# Patient Record
Sex: Male | Born: 1985 | Race: Black or African American | Hispanic: No | Marital: Single | Smoking: Current every day smoker
Health system: Southern US, Community
[De-identification: ages and names within clinical notes are randomized; demographics above are authoritative.]

## PROBLEM LIST (undated history)

## (undated) DIAGNOSIS — Z21 Asymptomatic human immunodeficiency virus [HIV] infection status: Secondary | ICD-10-CM

## (undated) DIAGNOSIS — Z9109 Other allergy status, other than to drugs and biological substances: Secondary | ICD-10-CM

## (undated) DIAGNOSIS — I10 Essential (primary) hypertension: Secondary | ICD-10-CM

## (undated) DIAGNOSIS — B20 Human immunodeficiency virus [HIV] disease: Secondary | ICD-10-CM

## (undated) HISTORY — PX: HERNIA REPAIR: SHX51

---
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
## 2006-03-17 ENCOUNTER — Emergency Department (HOSPITAL_COMMUNITY): Admission: EM | Admit: 2006-03-17 | Discharge: 2006-03-17 | Payer: Self-pay | Admitting: Family Medicine

## 2006-05-19 ENCOUNTER — Emergency Department (HOSPITAL_COMMUNITY): Admission: EM | Admit: 2006-05-19 | Discharge: 2006-05-19 | Payer: Self-pay | Admitting: Emergency Medicine

## 2007-01-28 ENCOUNTER — Emergency Department (HOSPITAL_COMMUNITY): Admission: EM | Admit: 2007-01-28 | Discharge: 2007-01-28 | Payer: Self-pay | Admitting: Emergency Medicine

## 2007-08-27 ENCOUNTER — Emergency Department (HOSPITAL_COMMUNITY): Admission: EM | Admit: 2007-08-27 | Discharge: 2007-08-27 | Payer: Self-pay | Admitting: Emergency Medicine

## 2007-08-29 ENCOUNTER — Emergency Department (HOSPITAL_COMMUNITY): Admission: EM | Admit: 2007-08-29 | Discharge: 2007-08-29 | Payer: Self-pay | Admitting: Emergency Medicine

## 2007-12-22 ENCOUNTER — Emergency Department (HOSPITAL_COMMUNITY): Admission: EM | Admit: 2007-12-22 | Discharge: 2007-12-22 | Payer: Self-pay | Admitting: Emergency Medicine

## 2008-01-26 ENCOUNTER — Emergency Department (HOSPITAL_COMMUNITY): Admission: EM | Admit: 2008-01-26 | Discharge: 2008-01-26 | Payer: Self-pay | Admitting: Emergency Medicine

## 2008-07-23 ENCOUNTER — Emergency Department (HOSPITAL_COMMUNITY): Admission: EM | Admit: 2008-07-23 | Discharge: 2008-07-23 | Payer: Self-pay | Admitting: Family Medicine

## 2008-10-29 ENCOUNTER — Emergency Department (HOSPITAL_COMMUNITY): Admission: EM | Admit: 2008-10-29 | Discharge: 2008-10-29 | Payer: Self-pay | Admitting: Emergency Medicine

## 2008-11-15 ENCOUNTER — Emergency Department (HOSPITAL_COMMUNITY): Admission: EM | Admit: 2008-11-15 | Discharge: 2008-11-15 | Payer: Self-pay | Admitting: Emergency Medicine

## 2009-05-25 ENCOUNTER — Emergency Department (HOSPITAL_COMMUNITY): Admission: EM | Admit: 2009-05-25 | Discharge: 2009-05-25 | Payer: Self-pay | Admitting: Emergency Medicine

## 2009-06-08 ENCOUNTER — Emergency Department (HOSPITAL_COMMUNITY): Admission: EM | Admit: 2009-06-08 | Discharge: 2009-06-08 | Payer: Self-pay | Admitting: Emergency Medicine

## 2009-06-11 ENCOUNTER — Emergency Department (HOSPITAL_COMMUNITY): Admission: EM | Admit: 2009-06-11 | Discharge: 2009-06-11 | Payer: Self-pay | Admitting: Emergency Medicine

## 2010-03-17 ENCOUNTER — Emergency Department (HOSPITAL_COMMUNITY)
Admission: EM | Admit: 2010-03-17 | Discharge: 2010-03-17 | Disposition: A | Discharge status: Home / Self Care | Payer: Self-pay | Attending: Emergency Medicine | Admitting: Emergency Medicine

## 2010-03-17 DIAGNOSIS — R369 Urethral discharge, unspecified: Secondary | ICD-10-CM | POA: Insufficient documentation

## 2010-03-17 DIAGNOSIS — Z202 Contact with and (suspected) exposure to infections with a predominantly sexual mode of transmission: Secondary | ICD-10-CM | POA: Insufficient documentation

## 2010-03-17 DIAGNOSIS — R300 Dysuria: Secondary | ICD-10-CM | POA: Insufficient documentation

## 2010-03-19 LAB — GC/CHLAMYDIA PROBE AMP, GENITAL: GC Probe Amp, Genital: NEGATIVE

## 2010-05-09 LAB — GC/CHLAMYDIA PROBE AMP, GENITAL: GC Probe Amp, Genital: NEGATIVE

## 2010-09-15 ENCOUNTER — Emergency Department (HOSPITAL_COMMUNITY): Payer: Self-pay

## 2010-09-15 ENCOUNTER — Emergency Department (HOSPITAL_COMMUNITY)
Admission: EM | Admit: 2010-09-15 | Discharge: 2010-09-15 | Disposition: A | Discharge status: Home / Self Care | Payer: No Typology Code available for payment source | Attending: Emergency Medicine | Admitting: Emergency Medicine

## 2010-09-15 DIAGNOSIS — M549 Dorsalgia, unspecified: Secondary | ICD-10-CM | POA: Insufficient documentation

## 2010-09-15 DIAGNOSIS — M25519 Pain in unspecified shoulder: Secondary | ICD-10-CM | POA: Insufficient documentation

## 2010-09-15 DIAGNOSIS — IMO0002 Reserved for concepts with insufficient information to code with codable children: Secondary | ICD-10-CM | POA: Insufficient documentation

## 2010-09-15 DIAGNOSIS — G8929 Other chronic pain: Secondary | ICD-10-CM | POA: Insufficient documentation

## 2010-10-16 ENCOUNTER — Inpatient Hospital Stay (INDEPENDENT_AMBULATORY_CARE_PROVIDER_SITE_OTHER)
Admission: RE | Admit: 2010-10-16 | Discharge: 2010-10-16 | Disposition: A | Discharge status: Home / Self Care | Payer: No Typology Code available for payment source | Source: Ambulatory Visit | Attending: Family Medicine | Admitting: Family Medicine

## 2010-10-16 DIAGNOSIS — J309 Allergic rhinitis, unspecified: Secondary | ICD-10-CM

## 2010-10-16 DIAGNOSIS — J45909 Unspecified asthma, uncomplicated: Secondary | ICD-10-CM

## 2010-11-05 LAB — GC/CHLAMYDIA PROBE AMP, GENITAL: Chlamydia, DNA Probe: NEGATIVE

## 2010-11-08 LAB — RPR: RPR Ser Ql: NONREACTIVE

## 2011-03-30 ENCOUNTER — Encounter (HOSPITAL_COMMUNITY): Payer: Self-pay | Admitting: *Deleted

## 2011-03-30 ENCOUNTER — Emergency Department (HOSPITAL_COMMUNITY)
Admission: EM | Admit: 2011-03-30 | Discharge: 2011-03-30 | Disposition: A | Discharge status: Home / Self Care | Payer: Self-pay | Attending: Emergency Medicine | Admitting: Emergency Medicine

## 2011-03-30 DIAGNOSIS — R112 Nausea with vomiting, unspecified: Secondary | ICD-10-CM | POA: Insufficient documentation

## 2011-03-30 DIAGNOSIS — R51 Headache: Secondary | ICD-10-CM | POA: Insufficient documentation

## 2011-03-30 DIAGNOSIS — H53149 Visual discomfort, unspecified: Secondary | ICD-10-CM | POA: Insufficient documentation

## 2011-03-30 MED ORDER — METOCLOPRAMIDE HCL 5 MG/ML IJ SOLN
10.0000 mg | Freq: Once | INTRAMUSCULAR | Status: AC
  Administered 2011-03-30: 10 mg via INTRAMUSCULAR
  Filled 2011-03-30: qty 2

## 2011-03-30 MED ORDER — NAPROXEN 500 MG PO TABS: 500.0000 mg | ORAL_TABLET | Freq: Two times a day (BID) | ORAL | Status: DC

## 2011-03-30 MED ORDER — OXYCODONE-ACETAMINOPHEN 5-325 MG PO TABS
2.0000 | ORAL_TABLET | Freq: Once | ORAL | Status: AC
  Administered 2011-03-30: 2 via ORAL
  Filled 2011-03-30: qty 2

## 2011-03-30 NOTE — ED Provider Notes (Signed)
History     CSN: [id number]  Arrival date & time 03/30/11  2001   First MD Initiated Contact with Patient 03/30/11 2153      Chief Complaint  Patient presents with  . Migraine    (Consider location/radiation/quality/duration/timing/severity/associated sxs/prior treatment) Patient is a 26 y.o. male presenting with migraine. The history is provided by the patient.  Migraine   patient reports 4 days of right sided headache with nausea and one episode of vomiting.  He's had no nausea or vomiting today.  He has a prior history of headaches in high school.  There is no family history of migraine headaches.  He has photophobia and phonophobia.  He denies arm or leg weakness.  He denies chest pain shortness of breath abdominal pain.  He denies fevers and chills.  He denies neck pain.  Nothing improves his symptoms.  His pain is moderate at this time  Past Medical History  Diagnosis Date  . Migraine     No past surgical history on file.  No family history on file.  History  Substance Use Topics  . Smoking status: Not on file  . Smokeless tobacco: Not on file  . Alcohol Use:       Review of Systems  All other systems reviewed and are negative.    Allergies  Review of patient's allergies indicates no known allergies.  Home Medications   Current Outpatient Rx  Name Route Sig Dispense Refill  . NAPROXEN 500 MG PO TABS Oral Take 1 tablet (500 mg total) by mouth 2 (two) times daily. 10 tablet 0    BP 129/71  Pulse 95  Temp(Src) 99.1 F (37.3 C) (Oral)  Resp 18  SpO2 100%  Physical Exam  Nursing note and vitals reviewed. Constitutional: He is oriented to person, place, and time. He appears well-developed and well-nourished.  HENT:  Head: Normocephalic and atraumatic.  Eyes: Pupils are equal, round, and reactive to light.  Cardiovascular: Regular rhythm.   Pulmonary/Chest: Effort normal.  Abdominal: Soft.  Neurological: He is alert and oriented to person, place,  and time.       5/5 strength in major muscle groups of  bilateral upper and lower extremities. Speech normal. No facial asymetry.     ED Course  Procedures (including critical care time)  Labs Reviewed - No data to display No results found.   1. Headache       MDM  Typical migraine headache for the pt. Non focal neuro exam. No recent head trauma. No fever. Doubt meningitis. Doubt intracranial bleed. Doubt normal pressure hydrocephalus. No indication for imaging. Will treat with migraine cocktail and reevaluate  11:15 PM  the patient feels much better at this time will DC home.  His neurologic exam is normal        Lyanne Co, MD 03/30/11 2315

## 2011-03-30 NOTE — ED Notes (Signed)
Pt in c/o headache with n/v since Tuesday, history of same

## 2011-03-30 NOTE — Discharge Instructions (Signed)

## 2011-05-19 ENCOUNTER — Encounter (HOSPITAL_COMMUNITY): Payer: Self-pay | Admitting: *Deleted

## 2011-05-19 ENCOUNTER — Emergency Department (HOSPITAL_COMMUNITY)
Admission: EM | Admit: 2011-05-19 | Discharge: 2011-05-19 | Disposition: A | Discharge status: Home / Self Care | Payer: Self-pay | Attending: Emergency Medicine | Admitting: Emergency Medicine

## 2011-05-19 DIAGNOSIS — H579 Unspecified disorder of eye and adnexa: Secondary | ICD-10-CM | POA: Insufficient documentation

## 2011-05-19 DIAGNOSIS — J309 Allergic rhinitis, unspecified: Secondary | ICD-10-CM | POA: Insufficient documentation

## 2011-05-19 DIAGNOSIS — R1110 Vomiting, unspecified: Secondary | ICD-10-CM | POA: Insufficient documentation

## 2011-05-19 DIAGNOSIS — H5789 Other specified disorders of eye and adnexa: Secondary | ICD-10-CM | POA: Insufficient documentation

## 2011-05-19 DIAGNOSIS — R6889 Other general symptoms and signs: Secondary | ICD-10-CM | POA: Insufficient documentation

## 2011-05-19 DIAGNOSIS — J3489 Other specified disorders of nose and nasal sinuses: Secondary | ICD-10-CM | POA: Insufficient documentation

## 2011-05-19 DIAGNOSIS — J302 Other seasonal allergic rhinitis: Secondary | ICD-10-CM

## 2011-05-19 HISTORY — DX: Other allergy status, other than to drugs and biological substances: Z91.09

## 2011-05-19 MED ORDER — DIPHENHYDRAMINE HCL 25 MG PO CAPS
25.0000 mg | ORAL_CAPSULE | Freq: Once | ORAL | Status: AC
  Administered 2011-05-19: 25 mg via ORAL
  Filled 2011-05-19: qty 1

## 2011-05-19 NOTE — ED Notes (Signed)
Pt reports Friday he started to have sneezing "real bad", then vomited x 1 Saturday.  Denies any nausea at this time. Pt also reports abd pain today, woke up with "crusty eyes." Denies taking anything for allergies.

## 2011-05-19 NOTE — Discharge Instructions (Signed)
Take over the counter Zyrtec, Claritin, Allegra, or Benadryl for your symptoms.    Allergies, Generic Allergies may happen from anything your body is sensitive to. This may be food, medicines, pollens, chemicals, and nearly anything around you in everyday life that produces allergens. An allergen is anything that causes an allergy producing substance. Heredity is often a factor in causing these problems. This means you may have some of the same allergies as your parents. Food allergies happen in all age groups. Food allergies are some of the most severe and life threatening. Some common food allergies are cow's milk, seafood, eggs, nuts, wheat, and soybeans. SYMPTOMS   Swelling around the mouth.   An itchy red rash or hives.   Vomiting or diarrhea.   Difficulty breathing.  SEVERE ALLERGIC REACTIONS ARE LIFE-THREATENING. This reaction is called anaphylaxis. It can cause the mouth and throat to swell and cause difficulty with breathing and swallowing. In severe reactions only a trace amount of food (for example, peanut oil in a salad) may cause death within seconds. Seasonal allergies occur in all age groups. These are seasonal because they usually occur during the same season every year. They may be a reaction to molds, grass pollens, or tree pollens. Other causes of problems are house dust mite allergens, pet dander, and mold spores. The symptoms often consist of nasal congestion, a runny itchy nose associated with sneezing, and tearing itchy eyes. There is often an associated itching of the mouth and ears. The problems happen when you come in contact with pollens and other allergens. Allergens are the particles in the air that the body reacts to with an allergic reaction. This causes you to release allergic antibodies. Through a chain of events, these eventually cause you to release histamine into the blood stream. Although it is meant to be protective to the body, it is this release that causes  your discomfort. This is why you were given anti-histamines to feel better. If you are unable to pinpoint the offending allergen, it may be determined by skin or blood testing. Allergies cannot be cured but can be controlled with medicine. Hay fever is a collection of all or some of the seasonal allergy problems. It may often be treated with simple over-the-counter medicine such as diphenhydramine. Take medicine as directed. Do not drink alcohol or drive while taking this medicine. Check with your caregiver or package insert for child dosages. If these medicines are not effective, there are many new medicines your caregiver can prescribe. Stronger medicine such as nasal spray, eye drops, and corticosteroids may be used if the first things you try do not work well. Other treatments such as immunotherapy or desensitizing injections can be used if all else fails. Follow up with your caregiver if problems continue. These seasonal allergies are usually not life threatening. They are generally more of a nuisance that can often be handled using medicine. HOME CARE INSTRUCTIONS   If unsure what causes a reaction, keep a diary of foods eaten and symptoms that follow. Avoid foods that cause reactions.   If hives or rash are present:   Take medicine as directed.   You may use an over-the-counter antihistamine (diphenhydramine) for hives and itching as needed.   Apply cold compresses (cloths) to the skin or take baths in cool water. Avoid hot baths or showers. Heat will make a rash and itching worse.   If you are severely allergic:   Following a treatment for a severe reaction, hospitalization is often required  for closer follow-up.   Wear a medic-alert bracelet or necklace stating the allergy.   You and your family must learn how to give adrenaline or use an anaphylaxis kit.   If you have had a severe reaction, always carry your anaphylaxis kit or EpiPen with you. Use this medicine as directed by your  caregiver if a severe reaction is occurring. Failure to do so could have a fatal outcome.  SEEK MEDICAL CARE IF:  You suspect a food allergy. Symptoms generally happen within 30 minutes of eating a food.   Your symptoms have not gone away within 2 days or are getting worse.   You develop new symptoms.   You want to retest yourself or your child with a food or drink you think causes an allergic reaction. Never do this if an anaphylactic reaction to that food or drink has happened before. Only do this under the care of a caregiver.  SEEK IMMEDIATE MEDICAL CARE IF:   You have difficulty breathing, are wheezing, or have a tight feeling in your chest or throat.   You have a swollen mouth, or you have hives, swelling, or itching all over your body.   You have had a severe reaction that has responded to your anaphylaxis kit or an EpiPen. These reactions may return when the medicine has worn off. These reactions should be considered life threatening.  MAKE SURE YOU:   Understand these instructions.   Will watch your condition.   Will get help right away if you are not doing well or get worse.  Document Released: 04/15/2002 Document Revised: 01/09/2011 Document Reviewed: 09/20/2007 [facility] Patient Information 2012 [place], [place].

## 2011-05-19 NOTE — ED Provider Notes (Signed)
History     CSN: [id number]  Arrival date & time 05/19/11  0745   First MD Initiated Contact with Patient 05/19/11 [phone]      Chief Complaint  Patient presents with  . Eye Drainage    sneezing, abd pain, vomiting.    (Consider location/radiation/quality/duration/timing/severity/associated sxs/prior treatment) HPI Comments: Patient with PMH significant for Seasonal Allergies comes in today with a chief complaint of repeated sneezing and itchy, watery eyes.  He reports that his symptoms began 2 days ago and have become progressively worse.  He denies any changes in vision or eye pain.  He states that he always has allergies this time of year.  He has not taken anything for his symptoms.  He denies any fever.  He reports one episode of vomiting after sneezing really hard repeatidly.  Denies any abdominal pain.  Denies any shortness of breath or rash.  The history is provided by the patient.    Past Medical History  Diagnosis Date  . Migraine   . Environmental allergies     History reviewed. No pertinent past surgical history.  History reviewed. No pertinent family history.  History  Substance Use Topics  . Smoking status: Current Everyday Smoker -- 1.0 packs/day    Types: Cigarettes  . Smokeless tobacco: Not on file  . Alcohol Use: No      Review of Systems  Constitutional: Negative for fever and chills.  HENT: Positive for rhinorrhea and sneezing.   Eyes: Positive for itching. Negative for photophobia, pain, redness and visual disturbance.       Bilateral watery eye discharge  Respiratory: Negative for cough, shortness of breath and wheezing.   Skin: Negative for rash.    Allergies  Review of patient's allergies indicates no known allergies.  Home Medications   Current Outpatient Rx  Name Route Sig Dispense Refill  . ACETAMINOPHEN 500 MG PO TABS Oral Take 1,000 mg by mouth every 6 (six) hours as needed. pain      BP 143/66  Pulse 97  Temp(Src) 98.2 F (36.8  C) (Oral)  Resp 16  SpO2 100%  Physical Exam  Nursing note and vitals reviewed. Constitutional: He appears well-developed and well-nourished. No distress.  HENT:  Head: Normocephalic and atraumatic.  Nose: Rhinorrhea present.  Mouth/Throat: Oropharynx is clear and moist.  Eyes: Conjunctivae, EOM and lids are normal. Pupils are equal, round, and reactive to light. Right eye exhibits no exudate. No foreign body present in the right eye. Left eye exhibits no exudate. No foreign body present in the left eye. Right conjunctiva is not injected. Left conjunctiva is not injected.       Bilateral watery discharge  Cardiovascular: Normal rate, regular rhythm and normal heart sounds.   Pulmonary/Chest: Effort normal and breath sounds normal. No respiratory distress. He has no wheezes. He has no rales. He exhibits no tenderness.  Neurological: He is alert.  Skin: Skin is warm and dry. No rash noted. He is not diaphoretic.  Psychiatric: He has a normal mood and affect.    ED Course  Procedures (including critical care time)  Labs Reviewed - No data to display No results found.   No diagnosis found.    MDM  Signs and symptoms consistent with Seasonal Allergies.  Patient with bilateral clear, watery discharge from eyes along with sneezing.  Normal sclera.  No eye pain or vision changes.          Pascal Lux Whittingham, PA-C 05/19/11 1730

## 2011-05-20 NOTE — ED Provider Notes (Signed)
Medical screening examination/treatment/procedure(s) were performed by non-physician practitioner and as supervising physician I was immediately available for consultation/collaboration.  Cheri Guppy, MD 05/20/11 [phone]

## 2011-10-27 ENCOUNTER — Emergency Department (HOSPITAL_COMMUNITY)
Admission: EM | Admit: 2011-10-27 | Discharge: 2011-10-28 | Disposition: A | Discharge status: Home / Self Care | Payer: Self-pay

## 2011-10-27 NOTE — ED Notes (Signed)
Pt called for room. No response from patient. Will try again later

## 2011-10-27 NOTE — ED Notes (Signed)
Pt called for triage x2. No response from pt

## 2011-10-27 NOTE — ED Notes (Signed)
Pt called for triage x3 with no response 

## 2012-01-04 ENCOUNTER — Encounter (HOSPITAL_COMMUNITY): Payer: Self-pay | Admitting: Emergency Medicine

## 2012-01-04 ENCOUNTER — Emergency Department (HOSPITAL_COMMUNITY)
Admission: EM | Admit: 2012-01-04 | Discharge: 2012-01-04 | Disposition: A | Discharge status: Home / Self Care | Payer: Self-pay | Attending: Emergency Medicine | Admitting: Emergency Medicine

## 2012-01-04 DIAGNOSIS — H9201 Otalgia, right ear: Secondary | ICD-10-CM

## 2012-01-04 DIAGNOSIS — H9209 Otalgia, unspecified ear: Secondary | ICD-10-CM | POA: Insufficient documentation

## 2012-01-04 DIAGNOSIS — J029 Acute pharyngitis, unspecified: Secondary | ICD-10-CM | POA: Insufficient documentation

## 2012-01-04 DIAGNOSIS — M542 Cervicalgia: Secondary | ICD-10-CM | POA: Insufficient documentation

## 2012-01-04 DIAGNOSIS — R1310 Dysphagia, unspecified: Secondary | ICD-10-CM | POA: Insufficient documentation

## 2012-01-04 DIAGNOSIS — G43909 Migraine, unspecified, not intractable, without status migrainosus: Secondary | ICD-10-CM | POA: Insufficient documentation

## 2012-01-04 DIAGNOSIS — F17200 Nicotine dependence, unspecified, uncomplicated: Secondary | ICD-10-CM | POA: Insufficient documentation

## 2012-01-04 DIAGNOSIS — Z8709 Personal history of other diseases of the respiratory system: Secondary | ICD-10-CM | POA: Insufficient documentation

## 2012-01-04 LAB — RAPID STREP SCREEN (MED CTR MEBANE ONLY): Streptococcus, Group A Screen (Direct): NEGATIVE

## 2012-01-04 MED ORDER — HYDROCODONE-ACETAMINOPHEN 7.5-500 MG/15ML PO SOLN
10.0000 mL | Freq: Once | ORAL | Status: AC
  Administered 2012-01-04: 10 mL via ORAL
  Filled 2012-01-04: qty 15

## 2012-01-04 MED ORDER — HYDROCODONE-ACETAMINOPHEN 7.5-500 MG/15ML PO SOLN: 15.0000 mL | Freq: Four times a day (QID) | ORAL | Status: DC | PRN

## 2012-01-04 NOTE — ED Provider Notes (Signed)
History     CSN: [id number]  Arrival date & time 01/04/12  1227   First MD Initiated Contact with Patient 01/04/12 1342      Chief Complaint  Patient presents with  . Otalgia  . Sore Throat    (Consider location/radiation/quality/duration/timing/severity/associated sxs/prior treatment) HPI  26 year old male presents c/o R ear pain.  Pt reports he had URI sxs 1 week ago which has improved.  Since yesterday he notice pain to R side of his face that radiates up to his R ear and down to neck.  Pain is sharp, throbbing, worsening with swallowing and with chewing.  Pain has been persistent, with decreased appetite and having trouble opening mouth wide.  Having a difficult time chewing.  Denies fever, chills, headache, neck stiffness, dental pain, throat swelling, cp, sob.  No n/v/d or rash.  No abd pain.  Pt tried using sweet oil for his R ear thinking it may be an ear infection, no improvement.      Past Medical History  Diagnosis Date  . Migraine   . Environmental allergies     No past surgical history on file.  No family history on file.  History  Substance Use Topics  . Smoking status: Current Every Day Smoker -- 1.0 packs/day    Types: Cigarettes  . Smokeless tobacco: Not on file  . Alcohol Use: No      Review of Systems  Constitutional: Negative for fever and chills.  HENT: Positive for ear pain, sore throat, trouble swallowing, neck pain and sinus pressure. Negative for hearing loss, nosebleeds, congestion, neck stiffness, dental problem, voice change, tinnitus and ear discharge.   Respiratory: Negative for shortness of breath.   Skin: Negative for rash.    Allergies  Review of patient's allergies indicates no known allergies.  Home Medications   Current Outpatient Rx  Name  Route  Sig  Dispense  Refill  . ACETAMINOPHEN 500 MG PO TABS   Oral   Take 1,000 mg by mouth every 6 (six) hours as needed. pain           BP 139/74  Pulse 100  Temp 99 F (37.2  C) (Oral)  Resp 16  SpO2 100%  Physical Exam  Nursing note and vitals reviewed. Constitutional: He is oriented to person, place, and time. He appears well-developed and well-nourished. No distress.  HENT:  Head: Normocephalic and atraumatic.  Right Ear: External ear normal.  Left Ear: External ear normal.  Mouth/Throat: Oropharynx is clear and moist.       Right ear mildly erythematous without dullness of TM, no retraction.  Pt exhibits mild trismus, however uvula midline, no PTA, or deep tissue infection. No ludwigs angina.  No pathology seen  Eyes: Conjunctivae normal are normal.  Neck: Normal range of motion. Neck supple.       Tenderness to R inferior jaw line, no overlying skin changes.  Cardiovascular: Normal rate and regular rhythm.   Pulmonary/Chest: Effort normal. No stridor. He has no wheezes. He exhibits no tenderness.  Abdominal: Soft. Bowel sounds are normal. There is no tenderness.  Musculoskeletal: Normal range of motion.  Lymphadenopathy:    He has no cervical adenopathy.  Neurological: He is alert and oriented to person, place, and time.  Skin: Skin is warm.  Psychiatric: He has a normal mood and affect.    ED Course  Procedures (including critical care time)  Results for orders placed during the hospital encounter of 01/04/12  RAPID STREP  SCREEN      Component Value Range   Streptococcus, Group A Screen (Direct) NEGATIVE  NEGATIVE   No results found.   1. Facial pain  MDM  Pt presents with sore throat and R ear pain.  Examination unremarkable, no obvious signs of infection.  Strep test obtain, lortab elixir given.  Will continue to monitor.     3:04 PM Pt felt much better after lortab.  ON reexamination, pt has no difficulty opening mouth, no signs of infection.  Recommend f/u with ENT as needed.  Strict reeturn precaution discussed.  Pt voice understanding and agrees with plan.  Pt request work note.    BP 124/72  Pulse 100  Temp 99 F (37.2 C)  (Oral)  Resp 16  SpO2 100%  I have reviewed nursing notes and vital signs.  I reviewed available ER/hospitalization records thought the EMR    Fayrene Helper, [place] 01/04/12 1506

## 2012-01-04 NOTE — ED Provider Notes (Signed)
Medical screening examination/treatment/procedure(s) were performed by non-physician practitioner and as supervising physician I was immediately available for consultation/collaboration.  Desera Graffeo R. Genesia Caslin, MD 01/04/12 1545 

## 2012-01-04 NOTE — ED Notes (Signed)
Pt c/o rt sided earache and sore throat since yesterday.

## 2013-06-06 ENCOUNTER — Emergency Department (HOSPITAL_COMMUNITY): Payer: Self-pay

## 2013-06-06 ENCOUNTER — Encounter (HOSPITAL_COMMUNITY): Payer: Self-pay | Admitting: Emergency Medicine

## 2013-06-06 ENCOUNTER — Emergency Department (HOSPITAL_COMMUNITY)
Admission: EM | Admit: 2013-06-06 | Discharge: 2013-06-06 | Disposition: A | Discharge status: Home / Self Care | Payer: Self-pay | Attending: Emergency Medicine | Admitting: Emergency Medicine

## 2013-06-06 DIAGNOSIS — S4980XA Other specified injuries of shoulder and upper arm, unspecified arm, initial encounter: Secondary | ICD-10-CM | POA: Insufficient documentation

## 2013-06-06 DIAGNOSIS — Z79899 Other long term (current) drug therapy: Secondary | ICD-10-CM | POA: Insufficient documentation

## 2013-06-06 DIAGNOSIS — M549 Dorsalgia, unspecified: Secondary | ICD-10-CM

## 2013-06-06 DIAGNOSIS — Z8679 Personal history of other diseases of the circulatory system: Secondary | ICD-10-CM | POA: Insufficient documentation

## 2013-06-06 DIAGNOSIS — S8990XA Unspecified injury of unspecified lower leg, initial encounter: Secondary | ICD-10-CM | POA: Insufficient documentation

## 2013-06-06 DIAGNOSIS — W19XXXA Unspecified fall, initial encounter: Secondary | ICD-10-CM

## 2013-06-06 DIAGNOSIS — S46909A Unspecified injury of unspecified muscle, fascia and tendon at shoulder and upper arm level, unspecified arm, initial encounter: Secondary | ICD-10-CM | POA: Insufficient documentation

## 2013-06-06 DIAGNOSIS — W1809XA Striking against other object with subsequent fall, initial encounter: Secondary | ICD-10-CM | POA: Insufficient documentation

## 2013-06-06 DIAGNOSIS — F17200 Nicotine dependence, unspecified, uncomplicated: Secondary | ICD-10-CM | POA: Insufficient documentation

## 2013-06-06 DIAGNOSIS — S99919A Unspecified injury of unspecified ankle, initial encounter: Secondary | ICD-10-CM

## 2013-06-06 DIAGNOSIS — Y939 Activity, unspecified: Secondary | ICD-10-CM | POA: Insufficient documentation

## 2013-06-06 DIAGNOSIS — Y929 Unspecified place or not applicable: Secondary | ICD-10-CM | POA: Insufficient documentation

## 2013-06-06 DIAGNOSIS — M79606 Pain in leg, unspecified: Secondary | ICD-10-CM

## 2013-06-06 DIAGNOSIS — S99929A Unspecified injury of unspecified foot, initial encounter: Secondary | ICD-10-CM

## 2013-06-06 DIAGNOSIS — M25519 Pain in unspecified shoulder: Secondary | ICD-10-CM

## 2013-06-06 DIAGNOSIS — IMO0002 Reserved for concepts with insufficient information to code with codable children: Secondary | ICD-10-CM | POA: Insufficient documentation

## 2013-06-06 DIAGNOSIS — S0990XA Unspecified injury of head, initial encounter: Secondary | ICD-10-CM | POA: Insufficient documentation

## 2013-06-06 MED ORDER — OXYCODONE-ACETAMINOPHEN 5-325 MG PO TABS
2.0000 | ORAL_TABLET | Freq: Once | ORAL | Status: AC
  Administered 2013-06-06: 2 via ORAL
  Filled 2013-06-06: qty 2

## 2013-06-06 MED ORDER — PROMETHAZINE HCL 25 MG PO TABS: 25.0000 mg | ORAL_TABLET | Freq: Four times a day (QID) | ORAL | Status: DC | PRN

## 2013-06-06 MED ORDER — ONDANSETRON 8 MG PO TBDP
8.0000 mg | ORAL_TABLET | Freq: Once | ORAL | Status: AC
  Administered 2013-06-06: 8 mg via ORAL
  Filled 2013-06-06: qty 1

## 2013-06-06 MED ORDER — OXYCODONE-ACETAMINOPHEN 5-325 MG PO TABS: 1.0000 | ORAL_TABLET | Freq: Four times a day (QID) | ORAL | Status: DC | PRN

## 2013-06-06 NOTE — ED Notes (Signed)
Pt A+Ox4, reports c/o 9/10 upper back and L shoulder pain since Saturday.  Pt reports was knocked into the back of a [employer] truck by a forklift.  Pt denies hitting head or LOC.  Reports hit upper back, L shoulder.  Pt also reports shooting pains into LLE this AM.  Pt denies n/t to extremities, ambulatory with steady gait.  +csm/+pulses.  Pt denies other issues/complaints.  Skin PWD.  Speaking full/clear sentences.  No obvious deformities noted.  NAD.

## 2013-06-06 NOTE — ED Notes (Signed)
Pt to radiology.

## 2013-06-06 NOTE — Progress Notes (Signed)
[facility] provided pt with a list of primary care resources and a [facility] [employer] Card application to help patient establish primary care.  °

## 2013-06-06 NOTE — ED Notes (Signed)
Pt aware awaiting radiology at this time.

## 2013-06-06 NOTE — Discharge Instructions (Signed)
Back Pain, Adult Back pain is very common. The pain often gets better over time. The cause of back pain is usually not dangerous. Most people can learn to manage their back pain on their own.  HOME CARE   Stay active. Start with short walks on flat ground if you can. Try to walk farther each day.  Do not sit, drive, or stand in one place for more than 30 minutes. Do not stay in bed.  Do not avoid exercise or work. Activity can help your back heal faster.  Be careful when you bend or lift an object. Bend at your knees, keep the object close to you, and do not twist.  Sleep on a firm mattress. Lie on your side, and bend your knees. If you lie on your back, put a pillow under your knees.  Only take medicines as told by your doctor.  Put ice on the injured area.  Put ice in a plastic bag.  Place a towel between your skin and the bag.  Leave the ice on for 15-20 minutes, 03-04 times a day for the first 2 to 3 days. After that, you can switch between ice and heat packs.  Ask your doctor about back exercises or massage.  Avoid feeling anxious or stressed. Find good ways to deal with stress, such as exercise. GET HELP RIGHT AWAY IF:   Your pain does not go away with rest or medicine.  Your pain does not go away in 1 week.  You have new problems.  You do not feel well.  The pain spreads into your legs.  You cannot control when you poop (bowel movement) or pee (urinate).  Your arms or legs feel weak or lose feeling (numbness).  You feel sick to your stomach (nauseous) or throw up (vomit).  You have belly (abdominal) pain.  You feel like you may pass out (faint). MAKE SURE YOU:   Understand these instructions.  Will watch your condition.  Will get help right away if you are not doing well or get worse. Document Released: 07/09/2007 Document Revised: 04/14/2011 Document Reviewed: 06/10/2010 [facility] Patient Information 2014 [place], [place].  Musculoskeletal  Pain Musculoskeletal pain is muscle and boney aches and pains. These pains can occur in any part of the body. Your caregiver may treat you without knowing the cause of the pain. They may treat you if blood or urine tests, X-rays, and other tests were normal.  CAUSES There is often not a definite cause or reason for these pains. These pains may be caused by a type of germ (virus). The discomfort may also come from overuse. Overuse includes working out too hard when your body is not fit. Boney aches also come from weather changes. Bone is sensitive to atmospheric pressure changes. HOME CARE INSTRUCTIONS   Ask when your test results will be ready. Make sure you get your test results.  Only take over-the-counter or prescription medicines for pain, discomfort, or fever as directed by your caregiver. If you were given medications for your condition, do not drive, operate machinery or power tools, or sign legal documents for 24 hours. Do not drink alcohol. Do not take sleeping pills or other medications that may interfere with treatment.  Continue all activities unless the activities cause more pain. When the pain lessens, slowly resume normal activities. Gradually increase the intensity and duration of the activities or exercise.  During periods of severe pain, bed rest may be helpful. Lay or sit in any position that  is comfortable.  Putting ice on the injured area.  Put ice in a bag.  Place a towel between your skin and the bag.  Leave the ice on for 15 to 20 minutes, 3 to 4 times a day.  Follow up with your caregiver for continued problems and no reason can be found for the pain. If the pain becomes worse or does not go away, it may be necessary to repeat tests or do additional testing. Your caregiver may need to look further for a possible cause. SEEK IMMEDIATE MEDICAL CARE IF:  You have pain that is getting worse and is not relieved by medications.  You develop chest pain that is associated  with shortness or breath, sweating, feeling sick to your stomach (nauseous), or throw up (vomit).  Your pain becomes localized to the abdomen.  You develop any new symptoms that seem different or that concern you. MAKE SURE YOU:   Understand these instructions.  Will watch your condition.  Will get help right away if you are not doing well or get worse. Document Released: 01/20/2005 Document Revised: 04/14/2011 Document Reviewed: 09/24/2012 [facility] Patient Information 2014 [place], [place].  Shoulder Pain The shoulder is the joint that connects your arm to your body. Muscles and band-like tissues that connect bones to muscles (tendons) hold the joint together. Shoulder pain is felt if an injury or medical problem affects one or more parts of the shoulder. HOME CARE   Put ice on the sore area.  Put ice in a plastic bag.  Place a towel between your skin and the bag.  Leave the ice on for 15-20 minutes, 03-04 times a day for the first 2 days.  Stop using cold packs if they do not help with the pain.  If you were given something to keep your shoulder from moving (sling, shoulder immobilizer), wear it as told. Only take it off to shower or bathe.  Move your arm as little as possible, but keep your hand moving to prevent puffiness (swelling).  Squeeze a soft ball or foam pad as much as possible to help prevent swelling.  Take medicine as told by your doctor. GET HELP RIGHT AWAY IF:   Your arm, hand, or fingers are numb or tingling.  Your arm, hand, or fingers are puffy (swollen), painful, or turn white or blue.  You have more pain.  You have progressing new pain in your arm, hand, or fingers.  Your hand or fingers get cold.  Your medicine does not help lessen your pain. MAKE SURE YOU:   Understand these instructions.  Will watch your condition.  Will get help right away if you are not doing well or get worse. Document Released: 07/09/2007 Document Revised: 10/15/2011  Document Reviewed: 08/04/2011 [facility]Exit[facility] Patient Information 2014 [place], [place].

## 2013-06-06 NOTE — ED Provider Notes (Signed)
CSN: [id number]     Arrival date & time 06/06/13  1029 History  This chart was scribed for non-physician practitioner, Junious SilkHannah Kaisyn Millea, PA-C working with Flint MelterElliott L Wentz, MD by Greggory StallionKayla Andersen, ED scribe. This patient was seen in room WTR9/WTR9 and the patient's care was started at 12:09 PM.   Chief Complaint  Patient presents with  . Back Pain   The history is provided by the patient. No language interpreter was used.   HPI Comments: Keith Blackwell is a 28 y.o. male who presents to the Emergency Department complaining of sudden onset, sharp, throbbing, upper back pain and left shoulder pain that started 2 days ago. Rates pain 9/10. Certain movements worsen the pain. Pt states a rack fell onto his left side and threw him down. Denies hitting his head or LOC but has been having a headache. He states he is also having shooting pains in his LLE that started this morning. Pt has taken tylenol 2 days ago with no relief. Denies confusion, disorientation, nausea, emesis.   Past Medical History  Diagnosis Date  . Migraine   . Environmental allergies    History reviewed. No pertinent past surgical history. No family history on file. History  Substance Use Topics  . Smoking status: Current Every Day Smoker -- 1.00 packs/day    Types: Cigarettes  . Smokeless tobacco: Not on file  . Alcohol Use: No    Review of Systems  Gastrointestinal: Negative for nausea and vomiting.  Musculoskeletal: Positive for arthralgias and back pain.  Neurological: Positive for headaches.  Psychiatric/Behavioral: Negative for confusion.  All other systems reviewed and are negative.  Allergies  Review of patient's allergies indicates no known allergies.  Home Medications   Prior to Admission medications   Medication Sig Start Date End Date Taking? Authorizing Provider  cetirizine (ZYRTEC) 10 MG tablet Take 10 mg by mouth daily.   Yes Historical Provider, MD   BP 142/82  Pulse 91  Temp(Src) 98.4 F (36.9 C)  (Oral)  Resp 16  SpO2 100%  Physical Exam  Nursing note and vitals reviewed. Constitutional: He is oriented to person, place, and time. He appears well-developed and well-nourished.  Non-toxic appearance. He does not have a sickly appearance. He does not appear ill. No distress.  Well appearing, NAD  HENT:  Head: Normocephalic and atraumatic.  Right Ear: External ear normal.  Left Ear: External ear normal.  Nose: Nose normal.  Mouth/Throat: Uvula is midline.  No broken or loose teeth  Eyes: Conjunctivae and EOM are normal. Pupils are equal, round, and reactive to light.  Neck: Normal range of motion. No spinous process tenderness and no muscular tenderness present. No tracheal deviation present.  Cardiovascular: Normal rate, regular rhythm, normal heart sounds, intact distal pulses and normal pulses.   Pulses:      Radial pulses are 2+ on the right side, and 2+ on the left side.       Posterior tibial pulses are 2+ on the right side, and 2+ on the left side.  Pulmonary/Chest: Effort normal and breath sounds normal. No stridor.  Abdominal: Soft. He exhibits no distension. There is no tenderness.  Musculoskeletal: Normal range of motion.  Tenderness to palpation over thoracic and lumbar spine. No deformities or step offs. No tenderness to palpation over cervical spine. Tenderness to palpation over left shoulder diffusely. Strength 5/5 in all extremites. Tender to palpation over left ankle diffusely. Compartments soft.  Neurovascularly intact. Sensation intact.   Neurological: He is alert  and oriented to person, place, and time. He has normal strength. Coordination normal. GCS eye subscore is 4. GCS verbal subscore is 5. GCS motor subscore is 6.  Finger nose finger normal. Grip strength 5/5 bilaterally. Gait is mildly antalgic, no ataxia.   Skin: Skin is warm and dry. He is not diaphoretic.  Psychiatric: He has a normal mood and affect. His behavior is normal.    ED Course  Procedures  (including critical care time)  DIAGNOSTIC STUDIES: Oxygen Saturation is 100% on RA, normal by my interpretation.    COORDINATION OF CARE: 12:14 PM-Discussed treatment plan which includes xray with pt at bedside and pt agreed to plan.   Labs Review Labs Reviewed - No data to display  Imaging Review Dg Thoracic Spine 2 View  06/06/2013   CLINICAL DATA:  BACK PAIN  EXAM: THORACIC SPINE - 2 VIEW  COMPARISON:  DG CHEST 2 VIEW dated 08/29/2007  FINDINGS: There is no evidence of thoracic spine fracture. Alignment is normal. No other significant bone abnormalities are identified.  IMPRESSION: Negative.   Electronically Signed   By: Salome HolmesHector  Cooper M.D.   On: 06/06/2013 13:53   Dg Lumbar Spine Complete  06/06/2013   CLINICAL DATA:  BACK PAIN  EXAM: LUMBAR SPINE - COMPLETE 4+ VIEW  COMPARISON:  DG LUMBAR SPINE COMPLETE dated 06/11/2009  FINDINGS: There is no evidence of lumbar spine fracture. Alignment is normal. Intervertebral disc spaces are maintained.  IMPRESSION: Negative.   Electronically Signed   By: Salome HolmesHector  Cooper M.D.   On: 06/06/2013 13:50   Dg Ankle Complete Left  06/06/2013   CLINICAL DATA:  Trauma and pain.  EXAM: LEFT ANKLE COMPLETE - 3+ VIEW  COMPARISON:  06/08/2005  FINDINGS: No acute fracture or dislocation. Base of fifth metatarsal and talar dome intact.  IMPRESSION: No acute osseous abnormality.   Electronically Signed   By: Jeronimo GreavesKyle  Talbot M.D.   On: 06/06/2013 13:48   Dg Shoulder Left  06/06/2013   CLINICAL DATA:  Left shoulder pain  EXAM: LEFT SHOULDER - 2+ VIEW  COMPARISON:  01/28/2007  FINDINGS: There is no evidence of fracture or dislocation. There is no evidence of arthropathy or other focal bone abnormality. Soft tissues are unremarkable.  IMPRESSION: No acute abnormality noted.   Electronically Signed   By: Alcide CleverMark  Lukens M.D.   On: 06/06/2013 13:30     EKG Interpretation None      MDM   Final diagnoses:  Back pain  Shoulder pain  Leg pain  Fall   Patient with back pain,  shoulder pain, and leg pain after a fall. No LOC, chest pain, shortness of breath, vomiting.  No neurological deficits and normal neuro exam.  Patient can walk but states is painful.  No loss of bowel or bladder control.  No concern for cauda equina.  No fever, night sweats, weight loss, h/o cancer, IVDU.  XRs negative for acute abnormality. RICE protocol and pain medicine indicated and discussed with patient. Return instructions given. Vital signs stable for discharge.   I personally performed the services described in this documentation, which was scribed in my presence. The recorded information has been reviewed and is accurate.  Mora BellmanHannah S Logan Vegh, PA-C 06/07/13 1435

## 2013-06-07 NOTE — ED Provider Notes (Signed)
Medical screening examination/treatment/procedure(s) were performed by non-physician practitioner and as supervising physician I was immediately available for consultation/collaboration.  Flint MelterElliott L Murphy Bundick, MD 06/07/13 [phone]

## 2013-08-25 ENCOUNTER — Emergency Department (HOSPITAL_COMMUNITY)
Admission: EM | Admit: 2013-08-25 | Discharge: 2013-08-25 | Disposition: A | Discharge status: Home / Self Care | Payer: Self-pay | Attending: Emergency Medicine | Admitting: Emergency Medicine

## 2013-08-25 ENCOUNTER — Encounter (HOSPITAL_COMMUNITY): Payer: Self-pay | Admitting: Emergency Medicine

## 2013-08-25 DIAGNOSIS — IMO0002 Reserved for concepts with insufficient information to code with codable children: Secondary | ICD-10-CM | POA: Insufficient documentation

## 2013-08-25 DIAGNOSIS — Y9389 Activity, other specified: Secondary | ICD-10-CM | POA: Insufficient documentation

## 2013-08-25 DIAGNOSIS — S46912A Strain of unspecified muscle, fascia and tendon at shoulder and upper arm level, left arm, initial encounter: Secondary | ICD-10-CM

## 2013-08-25 DIAGNOSIS — F17200 Nicotine dependence, unspecified, uncomplicated: Secondary | ICD-10-CM | POA: Insufficient documentation

## 2013-08-25 DIAGNOSIS — S4980XA Other specified injuries of shoulder and upper arm, unspecified arm, initial encounter: Secondary | ICD-10-CM | POA: Insufficient documentation

## 2013-08-25 DIAGNOSIS — Y929 Unspecified place or not applicable: Secondary | ICD-10-CM | POA: Insufficient documentation

## 2013-08-25 DIAGNOSIS — Z79899 Other long term (current) drug therapy: Secondary | ICD-10-CM | POA: Insufficient documentation

## 2013-08-25 DIAGNOSIS — Z8679 Personal history of other diseases of the circulatory system: Secondary | ICD-10-CM | POA: Insufficient documentation

## 2013-08-25 DIAGNOSIS — S46909A Unspecified injury of unspecified muscle, fascia and tendon at shoulder and upper arm level, unspecified arm, initial encounter: Secondary | ICD-10-CM | POA: Insufficient documentation

## 2013-08-25 MED ORDER — METHOCARBAMOL 500 MG PO TABS: 500.0000 mg | ORAL_TABLET | Freq: Two times a day (BID) | ORAL | Status: DC

## 2013-08-25 MED ORDER — IBUPROFEN 800 MG PO TABS: 800.0000 mg | ORAL_TABLET | Freq: Three times a day (TID) | ORAL | Status: DC

## 2013-08-25 NOTE — ED Notes (Signed)
Declined W/C at D/C and was escorted to lobby by RN. 

## 2013-08-25 NOTE — Discharge Instructions (Signed)
Your pain is likely related to a shoulder strain.  If no improvement after 4-5 days, please follow up closely with orthopedist specialist for further evaluation.    Deltoid Disruption A deltoid disruption is a rare injury in which the deltoid muscle detaches fully or partially from one of the bones of the shoulder (acromion), the shoulder blade (scapula), or both. This results in a partial loss of shoulder function, because the deltoid muscle is very important in the functioning of the shoulder joint. The deltoid muscle assists in raising the arm in front of you (flexion), raising the arm behind you (extension), and raising the arm to the side (abduction) of the shoulder joint. A deltoid disruption may also occur as the attachment of the deltoid muscle to the arm bone (humerus). However, this injury is even more uncommon. SYMPTOMS   A "popping" or tearing sensation felt at the time of injury.  Severe pain in the shoulder at the time of injury.  Tenderness, swelling, warmth or redness, and later bruising over and around the shoulder.  Shoulder weakness or inability to use the shoulder properly.  Changed contour of the shoulder either visually or felt by touch. This is more evident when trying to contract the muscle or lift the arm.  Loss of firm fullness when pushing on the area where the tendon ruptured (a defect between the end of the muscle and bone where they are separated from each other). CAUSES   Sudden episode of stressful over activity, particularly a major force to an already maximally contracted deltoid muscle.  Direct blow or injury.  Possibly, throwing.  Shoulder surgery (particularly on the rotator cuff). RISK INCREASES WITH:   Sports that require excessive deltoid muscle stress, especially throwing sports.  Contact sports.  Poor shoulder strength and flexibility.  Previous shoulder injury or surgery requiring movement of the deltoid.  Oral anabolic steroid  use. PREVENTION  Warm up and stretch properly before activity.  Allow for rest and recovery between activities.  Maintain physical fitness:  Cardiovascular fitness.  Shoulder flexibility.  Muscle strength and endurance. PROGNOSIS  This condition is usually curable with early and appropriate treatment. RELATED COMPLICATIONS   Weakness of the shoulder, especially if untreated.  Re-rupture of the muscle after treatment.  Prolonged disability.  Risks of surgery, including infection, injury to nerves (numbness, weakness, or paralysis), bleeding, hematoma, shoulder stiffness, shoulder weakness, pain with strenuous activity, and recurrent disruption.  Loss of shoulder contour.  Inability to repair deltoid. TREATMENT  Treatment initially involves using medicine and ice to reduce pain and inflammation. Heat therapy may be used for small injuries. Strength and stretching exercises may be recommended and may be performed at home or with a therapist. For large ruptures, surgical interventions are usually required. Deltoid repair surgeries are often difficult, because the deltoid tendon does not hold stitches well. If the rupture occurs in the muscle body, a repair cannot be made. In order to have the best likelihood of a good outcome with surgery, it is important for the injury to be treated within a few weeks of injury. After surgery, the shoulder may be immobilized for a period of time and physical therapy may follow. MEDICATION   If pain medicine is necessary, nonsteroidal anti-inflammatory medicines, such as aspirin and ibuprofen, or other minor pain relievers, such as acetaminophen, are often recommended.  Do not take pain medicine for 7 days before surgery.  Prescription pain relievers may be given if determined necessary by your caregiver. Use only as directed  and only as much as you need. COLD THERAPY  Cold treatment (icing) relieves pain and reduces inflammation. Cold treatment  should be applied for 10 to 15 minutes every 2 to 3 hours for inflammation and pain and immediately after any activity that aggravates your symptoms. Use ice packs or an ice massage. SEEK MEDICAL CARE IF:   Pain increases, despite treatment.  Any of the following occur after surgery:  Signs of infection, including fever, increased pain, swelling, redness, drainage, or bleeding in the surgical area.  New, unexplained symptoms develop. Drugs used in treatment may produce side effects. Document Released: 01/20/2005 Document Revised: 04/14/2011 Document Reviewed: 05/04/2008 [facility] Patient Information 2015 [place], [place]. This information is not intended to replace advice given to you by your health care provider. Make sure you discuss any questions you have with your health care provider.

## 2013-08-25 NOTE — ED Provider Notes (Signed)
CSN: [id number]     Arrival date & time 08/25/13  1846 History  This chart was scribed for non-physician practitioner, Fayrene HelperBowie Gill Delrossi, PA-C, working with Vanetta MuldersScott Zackowski, MD, by Bronson CurbJacqueline Melvin, ED Scribe. This patient was seen in room TR06C/TR06C and the patient's care was started at 7:09 PM.    Chief Complaint  Patient presents with  . Shoulder Pain      The history is provided by the patient. No language interpreter was used.    HPI Comments: Keith Blackwell is a 28 y.o. male who presents to the Emergency Department complaining of left shoulder pain that began yesterday. Patient states he was at and attempted to lift a heavy TV when he heard cracking in his shoulder. He reports he dropped the TV as a result. He describes the pain a shooting, and states is radiates down his left arm and across his left upper chest when he moves. He denies numbness. Patient has not taken anything for the pain. He denies any numbness or paresthesia. Patient states he was seen at the hospital recently after a 200lb metal rack fell on his upper back. Patient is left hand dominant.   Past Medical History  Diagnosis Date  . Migraine   . Environmental allergies    History reviewed. No pertinent past surgical history. No family history on file. History  Substance Use Topics  . Smoking status: Current Every Day Smoker -- 1.00 packs/day    Types: Cigarettes  . Smokeless tobacco: Not on file  . Alcohol Use: No    Review of Systems  Constitutional: Negative for fever.  Musculoskeletal: Positive for arthralgias (left shoulder).  Neurological: Negative for numbness.      Allergies  Review of patient's allergies indicates no known allergies.  Home Medications   Prior to Admission medications   Medication Sig Start Date End Date Taking? Authorizing Provider  cetirizine (ZYRTEC) 10 MG tablet Take 10 mg by mouth daily.    Historical Provider, MD  oxyCODONE-acetaminophen (PERCOCET/ROXICET) 5-325 MG per  tablet Take 1-2 tablets by mouth every 6 (six) hours as needed for severe pain. 06/06/13   Mora BellmanHannah S Merrell, PA-C  promethazine (PHENERGAN) 25 MG tablet Take 1 tablet (25 mg total) by mouth every 6 (six) hours as needed for nausea or vomiting. 06/06/13   Mora BellmanHannah S Merrell, PA-C   Triage Vitals: BP 130/72  Pulse 97  Temp(Src) 98.8 F (37.1 C) (Oral)  Resp 18  SpO2 98%  Physical Exam  Nursing note and vitals reviewed. Constitutional: He is oriented to person, place, and time. He appears well-developed and well-nourished. No distress.  HENT:  Head: Normocephalic and atraumatic.  Eyes: Conjunctivae and EOM are normal.  Neck: Neck supple. No tracheal deviation present.  Cardiovascular: Normal rate.   Pulmonary/Chest: Effort normal. No respiratory distress.  Musculoskeletal: Normal range of motion.  Left shoulder tenderness to the anterior left shoulder along the deltoid with decreased shoulder flexion, extension, abduction, and adduction due to pain, however, with normal passive ROM. 2+ distal pulses. Normal grip strength. No deformity noted. No signs of infection.  Neurological: He is alert and oriented to person, place, and time.  Skin: Skin is warm and dry.  Psychiatric: He has a normal mood and affect. His behavior is normal.    ED Course  Procedures (including critical care time)  DIAGNOSTIC STUDIES: Oxygen Saturation is 98% on room air, normal by my interpretation.    COORDINATION OF CARE: At 1914 Discussed treatment plan with patient which includes  muscle relaxer. Patient will be issued a note for work with specific order that advises against heavy lifting (nothing over 10 pounds). Ortho referral as needed. Patient agrees. Pt does not think he has a broken bone, xray not ordered. Suspect L shoulders train. Pt made aware to f/u with ortho if no improvement.  Sling provided for comfort.  Work note provided at [employer] request.   Labs Review Labs Reviewed - No data to display  Imaging  Review No results found.   EKG Interpretation None      MDM   Final diagnoses:  Left shoulder strain, initial encounter    BP 130/72  Pulse 97  Temp(Src) 98.8 F (37.1 C) (Oral)  Resp 18  SpO2 98%   I personally performed the services described in this documentation, which was scribed in my presence. The recorded information has been reviewed and is accurate.     Fayrene Helper, PA-C 08/25/13 1921

## 2013-08-25 NOTE — ED Notes (Signed)
Pt reports he was lifting heavy TV yesterday and heard a crack in L shoulder. Pt has since had pain to L shoulder radiating down arm and into upper L chest. Denies sob, nv.

## 2013-08-26 NOTE — ED Provider Notes (Signed)
Medical screening examination/treatment/procedure(s) were performed by non-physician practitioner and as supervising physician I was immediately available for consultation/collaboration.   EKG Interpretation None        Vanetta MuldersScott Antwuan Eckley, MD 08/26/13 [phone]

## 2014-02-08 ENCOUNTER — Emergency Department (HOSPITAL_COMMUNITY)
Admission: EM | Admit: 2014-02-08 | Discharge: 2014-02-08 | Disposition: A | Discharge status: Home / Self Care | Payer: Self-pay | Attending: Emergency Medicine | Admitting: Emergency Medicine

## 2014-02-08 ENCOUNTER — Encounter (HOSPITAL_COMMUNITY): Payer: Self-pay | Admitting: Emergency Medicine

## 2014-02-08 DIAGNOSIS — Z791 Long term (current) use of non-steroidal anti-inflammatories (NSAID): Secondary | ICD-10-CM | POA: Insufficient documentation

## 2014-02-08 DIAGNOSIS — L700 Acne vulgaris: Secondary | ICD-10-CM | POA: Insufficient documentation

## 2014-02-08 DIAGNOSIS — Z79899 Other long term (current) drug therapy: Secondary | ICD-10-CM | POA: Insufficient documentation

## 2014-02-08 DIAGNOSIS — Z720 Tobacco use: Secondary | ICD-10-CM | POA: Insufficient documentation

## 2014-02-08 DIAGNOSIS — Z8679 Personal history of other diseases of the circulatory system: Secondary | ICD-10-CM | POA: Insufficient documentation

## 2014-02-08 MED ORDER — IBUPROFEN 800 MG PO TABS
800.0000 mg | ORAL_TABLET | Freq: Three times a day (TID) | ORAL | Status: DC
Start: 2014-02-08 — End: 2015-01-05

## 2014-02-08 NOTE — ED Provider Notes (Signed)
CSN: [id number]     Arrival date & time 02/08/14  1957 History  This chart was scribed for non-physician practitioner working with No att. providers found by Elveria Rising, ED Scribe. This patient was seen in room TR09C/TR09C and the patient's care was started at 9:23 PM.   Chief Complaint  Patient presents with  . Otalgia    The paient said he had a "bump" in his ear.  He said he noticed it friday and it "popped" this morning.  He said he had pus running down his facewhen he woke up.   HPI Comments: Keith Blackwell is a 29 y.o. male who presents to the Emergency Department complaining of aching pain to his right ear. Patient reports noticing a bump six days ago. Patient reports pressing the pimple, releasing minimal amount of drainage. Patient complains of pain in the right ear and into right face.  The history is provided by the patient. No language interpreter was used.    Past Medical History  Diagnosis Date  . Migraine   . Environmental allergies    Past Surgical History  Procedure Laterality Date  . Hernia repair     History reviewed. No pertinent family history. History  Substance Use Topics  . Smoking status: Current Every Day Smoker -- 1.00 packs/day    Types: Cigarettes  . Smokeless tobacco: Not on file  . Alcohol Use: No    Review of Systems  Constitutional: Negative for fever and chills.  HENT: Positive for ear discharge and ear pain. Negative for facial swelling and hearing loss.     Allergies  Review of patient's allergies indicates no known allergies.  Home Medications   Prior to Admission medications   Medication Sig Start Date End Date Taking? Authorizing Provider  cetirizine (ZYRTEC) 10 MG tablet Take 10 mg by mouth daily.    Historical Provider, MD  ibuprofen (ADVIL,MOTRIN) 800 MG tablet Take 1 tablet (800 mg total) by mouth 3 (three) times daily. 08/25/13   Fayrene Helper, PA-C  methocarbamol (ROBAXIN) 500 MG tablet Take 1 tablet (500 mg total) by mouth 2  (two) times daily. 08/25/13   Fayrene Helper, PA-C  oxyCODONE-acetaminophen (PERCOCET/ROXICET) 5-325 MG per tablet Take 1-2 tablets by mouth every 6 (six) hours as needed for severe pain. 06/06/13   Mora Bellman, PA-C  promethazine (PHENERGAN) 25 MG tablet Take 1 tablet (25 mg total) by mouth every 6 (six) hours as needed for nausea or vomiting. 06/06/13   Mora Bellman, PA-C   Triage Vitals: BP 134/74 mmHg  Pulse 97  Temp(Src) 98.1 F (36.7 C) (Oral)  Resp 16  Ht  (1.778 m)  Wt 230 lb (104.327 kg)  BMI 33.00 kg/m2  SpO2 96% Physical Exam  Constitutional: He is oriented to person, place, and time. He appears well-developed and well-nourished. No distress.  HENT:  Head: Normocephalic and atraumatic.  Right Ear: Tympanic membrane and ear canal normal. No drainage.  Left Ear: Tympanic membrane and ear canal normal.  Ears:  Right ear small pimple with surrounding erythema, no active drainage, tender to palpation.  Eyes: EOM are normal.  Neck: Neck supple.  Pulmonary/Chest: Effort normal. No respiratory distress.  Musculoskeletal: Normal range of motion.  Neurological: He is alert and oriented to person, place, and time.  Skin: Skin is warm and dry.  Psychiatric: He has a normal mood and affect. His behavior is normal.  Nursing note and vitals reviewed.   ED Course  Procedures (including critical care  time)  COORDINATION OF CARE: 9:23 PM- Discussed treatment plan with patient at bedside and patient agreed to plan.   Labs Review Labs Reviewed - No data to display  Imaging Review No results found.   EKG Interpretation None      MDM   Final diagnoses:  Marvel [place]   I personally performed the services described in this documentation, which was scribed in my presence. The recorded information has been reviewed and is accurate.    Mellody DrownLauren Lanson Randle, PA-C 02/08/14 2147  Mirian MoMatthew Gentry, MD 02/10/14 [phone]

## 2014-02-08 NOTE — Discharge Instructions (Signed)
Your ear discomfort is due to a white head or pimple in your ear. Apply warm compress to promote drainage. Take anti-inflammatory or Tylenol for pain. Call for a follow up appointment with a Family or Primary Care Provider.  Return if Symptoms worsen.   Take medication as prescribed.    Emergency Department Resource Guide 1) Find a Doctor and Pay Out of Pocket Although you won't have to find out who is covered by your insurance plan, it is a good idea to ask around and get recommendations. You will then need to call the office and see if the doctor you have chosen will accept you as a new patient and what types of options they offer for patients who are self-pay. Some doctors offer discounts or will set up payment plans for their patients who do not have insurance, but you will need to ask so you aren't surprised when you get to your appointment.  2) Contact Your Local Health Department Not all health departments have doctors that can see patients for sick visits, but many do, so it is worth a call to see if yours does. If you don't know where your local health department is, you can check in your phone book. The CDC also has a tool to help you locate your state's health department, and many state websites also have listings of all of their local health departments.  3) Find a Walk-in Clinic If your illness is not likely to be very severe or complicated, you may want to try a walk in clinic. These are popping up all over the country in pharmacies, drugstores, and shopping centers. They're usually staffed by nurse practitioners or physician assistants that have been trained to treat common illnesses and complaints. They're usually fairly quick and inexpensive. However, if you have serious medical issues or chronic medical problems, these are probably not your best option.  No Primary Care Doctor: - Call Health Connect at  [phone] - they can help you locate a primary care doctor that  accepts your  insurance, provides certain services, etc. - Physician Referral Service- [phone]  Chronic Pain Problems: Organization         Address  Phone   Notes  [facility]  [phone] Patients need to be referred by their primary care doctor.   Medication Assistance: Organization         Address  Phone   Notes  [facility] [address] [place], [place] [address] [phone]) [phone] --Must be a resident of [facility] [place] [place] -- Must have NO insurance coverage whatsoever (no Medicaid/ Medicare, etc.) -- The pt. MUST have a primary care doctor that directs their care regularly and follows them in the community   [facility]  [phone]) [phone]   [employer]  [phone]) [phone]    Agencies that provide inexpensive medical care: Organization         Address  Phone   Notes  [facility]  [phone]) [phone]   [facility]    [phone]   [facility] [address] [place], [place] [address] [phone]   [facility] [address], [place] [phone]   [facility]    [phone]   [facility]    [phone]   [facility]  [address], [place] Phone:  [phone], Fax:  [phone] Hours of Operation:  9 am - 6 pm, M-F.  Also accepts Medicaid/Medicare and self-pay.  °[facility] ° [address], [place] Phone: [phone], Fax: [phone]. Hours of Operation:  8:30 am - 5:30 pm, M-F.  Also accepts Medicaid and self-pay.  °[facility] [place] [address], [place] Phone: ([phone]) [phone]   °[facility] [address], [place], [place] ([phone], Ext. 123 Mondays & Thursdays: 7-9 AM.  First 15 patients are seen on a first come, first serve basis. °  ° °Medicaid-accepting [place] Providers: ° °Organization          Address  Phone   Notes  °[facility] [address], [place] [phone] Also accepts self-pay patients.  °[facility] [address], [place] ° ([phone]) [phone]   °[facility] [address], [place] ([phone]) [phone]   °[facility] [address] [place] [address], [place] ([phone]) [phone]   °Veita Bland [address], [place]  ° ([phone]) [phone] Only accepts [place] Access Medicaid patients after they have their name applied to their card.  ° °Self-Pay (no insurance) in [place]: ° °Organization         Address  Phone   Notes  °Sickle Cell Patients, [facility] [address], [place] ([phone]) [phone]   °[facility] [address], [place] ([phone]) [phone]   °[facility] ° [address], [place] ([phone]) [phone]   °[facility]/Dr. Osei-Bonsu ° [address] [place] [address], [place] or [address], [place] [phone]) [phone] Phone number for both [place] and [place] locations is the same.  °[facility] [address], [place] ([phone]) [phone]   °[facility] [address] [place] [address], [place] or [address] ([phone]) [phone] °(336) [phone]   °[facility] [address], [place] (336) [phone], phone; [phone], fax Sees patients 1st and 3rd Saturday of every month.  Must not qualify for public or private insurance (i.e. Medicaid, Medicare, [place] Health Choice, Veterans' Benefits) • Household income should be no more than 200% of the poverty level •The clinic cannot treat you if you are pregnant or think you are pregnant • Sexually transmitted diseases are not treated at the clinic.  ° ° °Dental Care: °Organization         Address  Phone  Notes  °[facility] [address],  [place] ([phone]) [phone] Accepts children up to age 21 who are enrolled in Medicaid or [place] Health Choice; pregnant women with a Medicaid card; and children who have applied for Medicaid or [place] Health Choice, but were declined, whose parents can pay a reduced fee at time of service.  °[facility] [place]  [address], [place] ([phone]) [phone] Accepts children up to age 21 who are enrolled in Medicaid or [place] Health Choice; pregnant women with a Medicaid card; and children who have applied for Medicaid or  Health Choice, but were declined, whose parents can pay a reduced fee at time of service.  °[facility] ° [address], [place] ([phone]) [phone] Patients are seen by appointment only. Walk-ins are not accepted. [facility] will see patients 18 years   of age and older. °Monday - Tuesday (8am-5pm) °Most Wednesdays (8:30-5pm) °$30 per visit, cash only  °[facility] ° [address], [place] ([phone]) [phone] Patients are seen by appointment only. Walk-ins are not accepted. [facility] will see patients 18 years of age and older. °One Wednesday Evening (Monthly: Volunteer Based).  $30 per visit, cash only  °[facility]  ([phone]) [phone] for adults; Children under age 4, call Graduate Pediatric Dentistry at [phone]. Children aged 4-14, please call [phone] to request a pediatric application. ° Dental services are provided in all areas of dental care including fillings, crowns and bridges, complete and partial dentures, implants, gum treatment, root canals, and extractions. Preventive care is also provided. Treatment is provided to both adults and children. °Patients are selected via a lottery and there is often a waiting list. °  °[facility] [address], °[place] ° (336) [phone] [email or web address] °  °[facility] [address], [place], [place]  (336)[phone], Ext. 123 Second and Fourth Thursday of each month, opens at 6:30 AM; Clinic ends at 9 AM.  Patients are seen on a first-come first-served basis, and a limited number are seen during each clinic.  ° °[facility] ° [address], [place], [place] ([phone]) [phone]   Eligibility Requirements °You must have lived in [place], [place], or [place] counties for at least the last three months. °  You cannot be eligible for state or federal sponsored healthcare insurance, including Veterans Administration, Medicaid, or Medicare. °  You generally cannot be eligible for healthcare insurance through your employer.  °  How to apply: °Eligibility screenings are held every Tuesday and Wednesday afternoon from 1:00 pm until 4:00 pm. You do not need an appointment for the interview!  °[facility] [address], [place], [place] [phone]   °[facility]  [phone]   °[facility]  [phone]   °[facility]  336-570-6415   ° °Behavioral Health Resources in the Community: °Intensive Outpatient Programs °Organization         Address  Phone  Notes  °[facility] [address], [place], [place] [phone]   °[facility] [address], [place], [place] [phone]   °[facility] [address], [place], [place] ° 336-882-2125   °[facility] [address],  °[place], [place] 1-800-853-5163 or 336-641-4981   °Substance Abuse Resources °Organization         Address  Phone  Notes  °[facility]  336-882-2125   °[facility]  336-784-9470   °[facility]  336-285-9073   °[facility]  336-845-3988   °Residential & Outpatient Substance Abuse Program  1-800-659-3381   °Psychological Services °Organization         Address  Phone  Notes  °[facility]  336- [phone]   °[facility]  336- 378-7881    °[facility] [address], [place] 1-800-853-5163 or 336-641-4981   ° °Mobile Crisis Teams °Organization         Address  Phone  Notes  °[facility], Mobile Crisis Care Unit  1-877-626-1772   °Assertive °[facility] ° [address] [place], [place] 336-834-9664   °Sharon DeEsch [address] °[place]  336-554-5454   ° °Self-Help/Support Groups °Organization           Address  Phone             Notes  [place] of [place] - variety of support groups  [place] Call for more information  Narcotics Anonymous (NA), [facility] [address], [employer] [place]  2 meetings at this location   Special educational needs teacher         Address  Phone  Notes  [facility] [place],    [place]  1-212-646-8051   [facility]  [address], [place] [address], [place], [place]   [place], [place] [phone] Admissions: 8am-3pm M-F  [facility] [place] [address]    [place], [place] [phone]   [facility] [address] [place], [place], [place]   [facility] [address],  [place], [place]   [facility] Intensive Outpatient [address], [place], [place]   [facility]  [place], [place] 1-(430)088-3865 or [phone]   [facility] [address], [place], [place] Accepts Medicaid  [facility] [address],  [place] 1-(312)435-8742 Substance Abuse/Addiction Treatment   [facility] Organization         Address  Phone  Notes  [facility]  [phone]   Domenic Schwab, PhD [address] [place], [place]   [phone] or [phone]   [place]yler  [address] [place], [place] [phone]   [facility] [address], [place], [place] [phone] Insurance/Medicaid/sponsorship through [facility] and Families [address]                                    [place], [place] [phone] [facility] [address], [place] [phone]    Dr. Adele Schilder  [phone]   [facility] Dept. 1) [address] [place] 2) [place] 3)  [address], [place] [phone]) [phone]  [phone]   [place] [phone] or [phone] (After Hours)

## 2014-02-08 NOTE — ED Notes (Signed)
Declined W/C at D/C and was escorted to lobby by RN. 

## 2014-02-08 NOTE — ED Notes (Signed)
The paient said he had a "bump" in his ear.  He said he noticed it friday and it "popped" this morning.  He said he had pus running down his facewhen he woke up. He rates his pain 7/10.  His ear does look red.  He also says his face is swollen.

## 2014-06-23 ENCOUNTER — Encounter (HOSPITAL_COMMUNITY): Payer: Self-pay | Admitting: *Deleted

## 2014-06-23 ENCOUNTER — Emergency Department (HOSPITAL_COMMUNITY)
Admission: EM | Admit: 2014-06-23 | Discharge: 2014-06-23 | Disposition: A | Discharge status: Home / Self Care | Payer: Worker's Compensation | Attending: Emergency Medicine | Admitting: Emergency Medicine

## 2014-06-23 DIAGNOSIS — Z791 Long term (current) use of non-steroidal anti-inflammatories (NSAID): Secondary | ICD-10-CM | POA: Insufficient documentation

## 2014-06-23 DIAGNOSIS — Z720 Tobacco use: Secondary | ICD-10-CM | POA: Insufficient documentation

## 2014-06-23 DIAGNOSIS — Y998 Other external cause status: Secondary | ICD-10-CM | POA: Insufficient documentation

## 2014-06-23 DIAGNOSIS — W1789XA Other fall from one level to another, initial encounter: Secondary | ICD-10-CM | POA: Insufficient documentation

## 2014-06-23 DIAGNOSIS — Z79899 Other long term (current) drug therapy: Secondary | ICD-10-CM | POA: Insufficient documentation

## 2014-06-23 DIAGNOSIS — Z8679 Personal history of other diseases of the circulatory system: Secondary | ICD-10-CM | POA: Insufficient documentation

## 2014-06-23 DIAGNOSIS — Y9289 Other specified places as the place of occurrence of the external cause: Secondary | ICD-10-CM | POA: Insufficient documentation

## 2014-06-23 DIAGNOSIS — Y9389 Activity, other specified: Secondary | ICD-10-CM | POA: Insufficient documentation

## 2014-06-23 DIAGNOSIS — S93401A Sprain of unspecified ligament of right ankle, initial encounter: Secondary | ICD-10-CM | POA: Insufficient documentation

## 2014-06-23 MED ORDER — HYDROCODONE-ACETAMINOPHEN 5-325 MG PO TABS: 2.0000 | ORAL_TABLET | ORAL | Status: DC | PRN

## 2014-06-23 NOTE — ED Provider Notes (Signed)
CSN: [id number]     Arrival date & time 06/23/14  1215 History  This chart was scribed for non-physician practitioner, Cheron SchaumannLeslie Derald Lorge, working with Toy CookeyMegan Docherty, MD by Richarda Overlieichard Holland, ED Scribe. This patient was seen in room WTR6/WTR6 and the patient's care was started at 12:35 PM.   Chief Complaint  Patient presents with  . Leg Pain   The history is provided by the patient. No language interpreter was used.   HPI Comments: Keith Blackwell is a 29 y.o. male who presents to the Emergency Department complaining of worsening, right ankle pain for the last 4 days. 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 He states that he was seen at an UC and received an x-ray and was given ibuprofen and aleve. Pt states his pain is now radiating up his right hip and into his right lower back. He states that he did not injure his back 4 days ago. Pt states that weight bearing aggravates his pain. He reports NKDA.   Past Medical History  Diagnosis Date  . Migraine   . Environmental allergies    Past Surgical History  Procedure Laterality Date  . Hernia repair     History reviewed. No pertinent family history. History  Substance Use Topics  . Smoking status: Current Every Day Smoker -- 1.00 packs/day    Types: Cigarettes  . Smokeless tobacco: Not on file  . Alcohol Use: No    Review of Systems  Musculoskeletal: Positive for joint swelling and arthralgias.  Neurological: Negative for weakness and numbness.   Allergies  Review of patient's allergies indicates no known allergies.  Home Medications   Prior to Admission medications   Medication Sig Start Date End Date Taking? Authorizing Provider  cetirizine (ZYRTEC) 10 MG tablet Take 10 mg by mouth daily.    Historical Provider, MD  ibuprofen (ADVIL,MOTRIN) 800 MG tablet Take 1 tablet (800 mg total) by mouth 3 (three) times daily. 02/08/14   Mellody DrownLauren Parker, PA-C  methocarbamol (ROBAXIN) 500 MG tablet Take 1 tablet  (500 mg total) by mouth 2 (two) times daily. 08/25/13   Fayrene HelperBowie Tran, PA-C  oxyCODONE-acetaminophen (PERCOCET/ROXICET) 5-325 MG per tablet Take 1-2 tablets by mouth every 6 (six) hours as needed for severe pain. 06/06/13   Junious SilkHannah Merrell, PA-C  promethazine (PHENERGAN) 25 MG tablet Take 1 tablet (25 mg total) by mouth every 6 (six) hours as needed for nausea or vomiting. 06/06/13   Junious SilkHannah Merrell, PA-C   BP 119/70 mmHg  Pulse 95  Temp(Src) 98.4 F (36.9 C) (Oral)  Resp 18  Ht 5\' 10"  (1.778 m)  Wt 238 lb (107.956 kg)  BMI 34.15 kg/m2  SpO2 100%   Physical Exam  Constitutional: He is oriented to person, place, and time. He appears well-developed and well-nourished.  HENT:  Head: Normocephalic and atraumatic.  Cardiovascular: Normal rate.   Pulmonary/Chest: Effort normal. No respiratory distress.  Abdominal: He exhibits no distension.  Musculoskeletal: He exhibits edema.  Swollen, minimal bruising to right ankle. Decreased ROM.   Neurological: He is alert and oriented to person, place, and time.  Skin: Skin is warm and dry.  Psychiatric: He has a normal mood and affect.  Nursing note and vitals reviewed.   ED Course  Procedures   DIAGNOSTIC STUDIES: Oxygen Saturation is 100% on RA, normal by my interpretation.    COORDINATION OF CARE: 12:41 PM Discussed treatment plan with pt at bedside and pt agreed to plan.  Labs Review Labs Reviewed - No data to display  Imaging Review No results found.   EKG Interpretation None      MDM  Xray available on care everywhere, reviewd, no fracture   Final diagnoses:  Ankle sprain, right, initial encounter    aso cruthes Hydrocodone oow x 3 days Follow up with workers comp as scheduled    I personally performed the services in this documentation, which was scribed in my presence.  The recorded information has been reviewed and considered.   Barnet PallKaren SofiaPAC.  Elson AreasLeslie K Lutie Pickler, PA-C 06/23/14 [address]1621  Ursala Cressy K BramanSofia, PA-C 06/23/14  1621  Toy CookeyMegan Docherty, MD 06/24/14 2036

## 2014-06-23 NOTE — Discharge Instructions (Signed)

## 2014-06-23 NOTE — ED Notes (Signed)
Pt reports falling off truck on last Tuesday where he rolled the right ankle. Right Foot pain that radiates up the right hip. Pt went to an UC where they did xray of the right foot (neg), gave ibuprofen and sleeve for the knee. Pt states pain is now unbearable and is unable to bear weight and has swelling.

## 2014-06-23 NOTE — ED Notes (Signed)
Communication with Ortho 

## 2014-06-23 NOTE — ED Notes (Signed)
PA at bedside.

## 2014-06-23 NOTE — ED Notes (Signed)
Patient fell from a truck on Tuesday and [facility]. The leg is not broken but it continues to swell and is unrelieved with ibuprofen.

## 2014-12-05 ENCOUNTER — Emergency Department (HOSPITAL_COMMUNITY)
Admission: EM | Admit: 2014-12-05 | Discharge: 2014-12-05 | Disposition: A | Discharge status: Home / Self Care | Payer: Self-pay | Attending: Emergency Medicine | Admitting: Emergency Medicine

## 2014-12-05 ENCOUNTER — Emergency Department (HOSPITAL_COMMUNITY): Payer: Self-pay

## 2014-12-05 ENCOUNTER — Encounter (HOSPITAL_COMMUNITY): Payer: Self-pay | Admitting: Emergency Medicine

## 2014-12-05 DIAGNOSIS — B353 Tinea pedis: Secondary | ICD-10-CM | POA: Insufficient documentation

## 2014-12-05 DIAGNOSIS — Z8679 Personal history of other diseases of the circulatory system: Secondary | ICD-10-CM | POA: Insufficient documentation

## 2014-12-05 DIAGNOSIS — Z720 Tobacco use: Secondary | ICD-10-CM | POA: Insufficient documentation

## 2014-12-05 DIAGNOSIS — W25XXXA Contact with sharp glass, initial encounter: Secondary | ICD-10-CM | POA: Insufficient documentation

## 2014-12-05 DIAGNOSIS — S99921A Unspecified injury of right foot, initial encounter: Secondary | ICD-10-CM | POA: Insufficient documentation

## 2014-12-05 DIAGNOSIS — Y998 Other external cause status: Secondary | ICD-10-CM | POA: Insufficient documentation

## 2014-12-05 DIAGNOSIS — Y9289 Other specified places as the place of occurrence of the external cause: Secondary | ICD-10-CM | POA: Insufficient documentation

## 2014-12-05 DIAGNOSIS — Z79899 Other long term (current) drug therapy: Secondary | ICD-10-CM | POA: Insufficient documentation

## 2014-12-05 DIAGNOSIS — Z791 Long term (current) use of non-steroidal anti-inflammatories (NSAID): Secondary | ICD-10-CM | POA: Insufficient documentation

## 2014-12-05 DIAGNOSIS — Y9389 Activity, other specified: Secondary | ICD-10-CM | POA: Insufficient documentation

## 2014-12-05 MED ORDER — TERBINAFINE HCL 1 % EX CREA: 1.0000 "application " | TOPICAL_CREAM | Freq: Two times a day (BID) | CUTANEOUS | Status: DC

## 2014-12-05 NOTE — Discharge Instructions (Signed)
Your xray showed no retained glass. You might have a very small piece retained in the heel. Watch for signs of infection such as heat, redness, swelling and pus (creamy yellow or white discharge from the wound). Tenderness. Sliver Removal, Care After A sliver--also called a splinter--is a small and thin broken piece of an object that gets stuck (embedded) under the skin. A sliver can create a deep wound that can become infected. It is important to care for the wound after a sliver is removed to help prevent infection and other problems from developing. WHAT TO EXPECT AFTER THE PROCEDURE Slivers often break into smaller pieces when they are removed. If pieces of your sliver broke off and stayed in your skin, you will eventually see them working themselves out and you may feel some pain at the wound site. This is normal. HOME CARE INSTRUCTIONS  Keep all follow-up visits as directed by your health care provider. This is important.  There are many different ways to close and cover a wound, including stitches (sutures) and adhesive strips. Follow your health care provider's instructions about:  Wound care.  Bandage (dressing) changes and removal.  Wound closure removal.  Check the wound site every day for signs of infection. Watch for:  Red streaks coming from the wound.  Fever.  Redness or tenderness around the wound.  Fluid, blood, or pus coming from the wound.  A bad smell coming from the wound. SEEK MEDICAL CARE IF:  You think that a piece of the sliver is still in your skin.  Your wound was closed, as with sutures, and the edges of the wound break open.  You have signs of infection, including:  New or worsening redness around the wound.  New or worsening tenderness around the wound.  Fluid, blood, or pus coming from the wound.  A bad smell coming from the wound or dressing. SEEK IMMEDIATE MEDICAL CARE IF: You have any of the following signs of infection:  Red streaks  coming from the wound.  An unexplained fever.   This information is not intended to replace advice given to you by your health care provider. Make sure you discuss any questions you have with your health care provider.   Document Released: 01/18/2000 Document Revised: 02/10/2014 Document Reviewed: 09/22/2013 Elsevier Interactive Patient Education [employer]vier Inc.

## 2014-12-05 NOTE — ED Provider Notes (Signed)
CSN: [id number]     Arrival date & time 12/05/14  1138 History  By signing my name below, I, Ronney Lion, attest that this documentation has been prepared under the direction and in the presence of Arthor Captain, PA-C. Electronically Signed: Ronney Lion, ED Scribe. 12/05/2014. 2:28 PM.    Chief Complaint  Patient presents with  . Foreign Body    possible glass in r/foot   The history is provided by the patient. No language interpreter was used.    HPI Comments: Keith Blackwell is a 29 y.o. male who presents to the Emergency Department complaining of possible lodged piece of glass in his right heel, with onset 3 days ago. Patient states his son had knocked over a fish tank and he had cleaned it up but thinks he may have lodged a small piece of glass in his foot from doing so. He states he had removed a few shards of glass from his foot but is still worried because he states he feels "something funny" in the bottom in his left foot. Patient states he came in today after his mother urged him to come in, for fear that he may develop gangrene.    Past Medical History  Diagnosis Date  . Migraine   . Environmental allergies    Past Surgical History  Procedure Laterality Date  . Hernia repair     History reviewed. No pertinent family history. Social History  Substance Use Topics  . Smoking status: Current Every Day Smoker -- 1.00 packs/day    Types: Cigarettes  . Smokeless tobacco: None  . Alcohol Use: No    Review of Systems  Ten systems reviewed and are negative for acute change, except as noted in the HPI.    Allergies  Review of patient's allergies indicates no known allergies.  Home Medications   Prior to Admission medications   Medication Sig Start Date End Date Taking? Authorizing Provider  cetirizine (ZYRTEC) 10 MG tablet Take 10 mg by mouth daily.    Historical Provider, MD  HYDROcodone-acetaminophen (NORCO/VICODIN) 5-325 MG per tablet Take 2 tablets by mouth every 4  (four) hours as needed. 06/23/14   Elson Areas, PA-C  ibuprofen (ADVIL,MOTRIN) 800 MG tablet Take 1 tablet (800 mg total) by mouth 3 (three) times daily. 02/08/14   Mellody Drown, PA-C  methocarbamol (ROBAXIN) 500 MG tablet Take 1 tablet (500 mg total) by mouth 2 (two) times daily. 08/25/13   Fayrene Helper, PA-C  oxyCODONE-acetaminophen (PERCOCET/ROXICET) 5-325 MG per tablet Take 1-2 tablets by mouth every 6 (six) hours as needed for severe pain. 06/06/13   Junious Silk, PA-C  promethazine (PHENERGAN) 25 MG tablet Take 1 tablet (25 mg total) by mouth every 6 (six) hours as needed for nausea or vomiting. 06/06/13   Junious Silk, PA-C  terbinafine (LAMISIL AT) 1 % cream Apply 1 application topically 2 (two) times daily. 12/05/14   Quiera Diffee, PA-C   BP 118/71 mmHg  Pulse 85  Temp(Src) 98.6 F (37 C) (Oral)  SpO2 99% Physical Exam  Constitutional: He is oriented to person, place, and time. He appears well-developed and well-nourished. No distress.  HENT:  Head: Normocephalic and atraumatic.  Eyes: Conjunctivae and EOM are normal.  Neck: Neck supple. No tracheal deviation present.  Cardiovascular: Normal rate.   Pulmonary/Chest: Effort normal. No respiratory distress.  Musculoskeletal: Normal range of motion.  R ankle warm, tender to palpation over the anterior ankle joint. No redness. FROM.    Neurological: He is  alert and oriented to person, place, and time.  Ambulatory   Skin: Skin is warm and dry.  Psychiatric: He has a normal mood and affect. His behavior is normal.  Nursing note and vitals reviewed.   ED Course  Procedures (including critical care time)  DIAGNOSTIC STUDIES: Oxygen Saturation is 100% on RA, normal by my interpretation.    COORDINATION OF CARE: 2:28 PM - Pt reassured that gangrene highly unlikely. Negative XR findings discussed with pt.  Discussed treatment plan with pt at bedside which includes watch and wait, observe for signs of infection in OP setting. Pt  verbalized understanding and agreed to plan.  Labs Review Labs Reviewed - No data to display  Imaging Review Dg Foot 2 Views Right  12/05/2014  CLINICAL DATA:  Possible foreign body in the soft tissues of the right foot. The patient stepped on glass 4 days ago. EXAM: RIGHT FOOT - 2 VIEW COMPARISON:  Radiographs dated 08/27/2007 FINDINGS: There is no visible foreign body in the soft tissues of the foot. Osseous structures are normal. IMPRESSION: Normal exam. Electronically Signed   By: Francene BoyersJames  Maxwell M.D.   On: 12/05/2014 13:34   I have personally reviewed and evaluated these images and lab results as part of my medical decision-making.   EKG Interpretation None      MDM   Final diagnoses:  Foot injury, right, initial encounter  Tinea pedis of both feet   Patient with concern for retained glass in foot. None seen on XR. He declines numbing and probing the foot. No palpable evidence of FB. F.u at ED if sxs worsen. The patient appears reasonably screened and/or stabilized for discharge and I doubt any other medical condition or other [facility] requiring further screening, evaluation, or treatment in the ED at this time prior to discharge.   I personally performed the services described in this documentation, which was scribed in my presence. The recorded information has been reviewed and is accurate.         Arthor Captainbigail Kamilia Carollo, PA-C 12/08/14 1303  Linwood DibblesJon Knapp, MD 12/11/14 [phone]

## 2014-12-05 NOTE — ED Notes (Signed)
Possible glass in r/heel x 3 days. Pt reports that he remove a few shards of glass from r/foot. Broken glass from shattered fish tap was on the floor

## 2014-12-05 NOTE — Progress Notes (Signed)
Pt seen by [facility] staff Stacy and given [facility] providers for f/u care

## 2015-01-05 ENCOUNTER — Emergency Department (HOSPITAL_COMMUNITY)
Admission: EM | Admit: 2015-01-05 | Discharge: 2015-01-05 | Disposition: A | Discharge status: Home / Self Care | Payer: Self-pay | Attending: Emergency Medicine | Admitting: Emergency Medicine

## 2015-01-05 ENCOUNTER — Encounter (HOSPITAL_COMMUNITY): Payer: Self-pay

## 2015-01-05 ENCOUNTER — Emergency Department (HOSPITAL_COMMUNITY): Payer: Self-pay

## 2015-01-05 DIAGNOSIS — S299XXA Unspecified injury of thorax, initial encounter: Secondary | ICD-10-CM | POA: Insufficient documentation

## 2015-01-05 DIAGNOSIS — Y9389 Activity, other specified: Secondary | ICD-10-CM | POA: Insufficient documentation

## 2015-01-05 DIAGNOSIS — W06XXXA Fall from bed, initial encounter: Secondary | ICD-10-CM | POA: Insufficient documentation

## 2015-01-05 DIAGNOSIS — Z8679 Personal history of other diseases of the circulatory system: Secondary | ICD-10-CM | POA: Insufficient documentation

## 2015-01-05 DIAGNOSIS — R079 Chest pain, unspecified: Secondary | ICD-10-CM

## 2015-01-05 DIAGNOSIS — Z79899 Other long term (current) drug therapy: Secondary | ICD-10-CM | POA: Insufficient documentation

## 2015-01-05 DIAGNOSIS — Y9289 Other specified places as the place of occurrence of the external cause: Secondary | ICD-10-CM | POA: Insufficient documentation

## 2015-01-05 DIAGNOSIS — F17210 Nicotine dependence, cigarettes, uncomplicated: Secondary | ICD-10-CM | POA: Insufficient documentation

## 2015-01-05 DIAGNOSIS — Y998 Other external cause status: Secondary | ICD-10-CM | POA: Insufficient documentation

## 2015-01-05 LAB — I-STAT TROPONIN, ED: Troponin i, poc: 0.01 ng/mL (ref 0.00–0.08)

## 2015-01-05 LAB — CBC
HEMATOCRIT: 43.2 % (ref 39.0–52.0)
HEMOGLOBIN: 13.4 g/dL (ref 13.0–17.0)
MCH: 24.5 pg — AB (ref 26.0–34.0)
MCHC: 31 g/dL (ref 30.0–36.0)
MCV: 79.1 fL (ref 78.0–100.0)
Platelets: 181 10*3/uL (ref 150–400)
RBC: 5.46 MIL/uL (ref 4.22–5.81)
RDW: 14.4 % (ref 11.5–15.5)
WBC: 5.7 10*3/uL (ref 4.0–10.5)

## 2015-01-05 LAB — BASIC METABOLIC PANEL
ANION GAP: 7 (ref 5–15)
BUN: 19 mg/dL (ref 6–20)
CO2: 27 mmol/L (ref 22–32)
Calcium: 9.5 mg/dL (ref 8.9–10.3)
Chloride: 106 mmol/L (ref 101–111)
Creatinine, Ser: 1.16 mg/dL (ref 0.61–1.24)
GFR calc Af Amer: >60 mL/min (ref >60)
GFR calc non Af Amer: >60 mL/min (ref >60)
GLUCOSE: 96 mg/dL (ref 65–99)
POTASSIUM: 4.5 mmol/L (ref 3.5–5.1)
Sodium: 140 mmol/L (ref 135–145)

## 2015-01-05 MED ORDER — IBUPROFEN 600 MG PO TABS: 600.0000 mg | ORAL_TABLET | Freq: Four times a day (QID) | ORAL | Status: DC | PRN

## 2015-01-05 NOTE — ED Provider Notes (Signed)
CSN: [id number]     Arrival date & time 01/05/15  4098 History   First MD Initiated Contact with Patient 01/05/15 1010     Chief Complaint  Patient presents with  . Chest Pain     (Consider location/radiation/quality/duration/timing/severity/associated sxs/prior Treatment) HPI Patient reports he got a cramp in his left back while he was lying in bed. He reports that he rolled over because of that and then he got a sudden pain in a focal area on his right upper chest near the sternum. There is no associated shortness of breath, nausea or diaphoresis. He does note that he had a pretty bad cold a few weeks ago. He reports that that has gotten better and he has minimal associated symptoms now. He has no lower extremity swelling or calf pain. Family history is negative for early onset coronary artery disease or sudden death. Patient does smoke. Past Medical History  Diagnosis Date  . Migraine   . Environmental allergies    Past Surgical History  Procedure Laterality Date  . Hernia repair     History reviewed. No pertinent family history. Social History  Substance Use Topics  . Smoking status: Current Every Day Smoker -- 1.00 packs/day    Types: Cigarettes  . Smokeless tobacco: None  . Alcohol Use: No    Review of Systems 10 Systems reviewed and are negative for acute change except as noted in the HPI.    Allergies  Review of patient's allergies indicates no known allergies.  Home Medications   Prior to Admission medications   Medication Sig Start Date End Date Taking? Authorizing Provider  cetirizine (ZYRTEC) 10 MG tablet Take 10 mg by mouth daily.   Yes Historical Provider, MD  ibuprofen (ADVIL,MOTRIN) 600 MG tablet Take 1 tablet (600 mg total) by mouth every 6 (six) hours as needed. 01/05/15   Arby Barrette, MD  terbinafine (LAMISIL AT) 1 % cream Apply 1 application topically 2 (two) times daily. Patient not taking: Reported on 01/05/2015 12/05/14   Arthor Captain, PA-C   BP  143/77 mmHg  Pulse 94  Temp(Src) 98 F (36.7 C) (Oral)  Resp 21  SpO2 98% Physical Exam  Constitutional: He is oriented to person, place, and time. He appears well-developed and well-nourished.  HENT:  Head: Normocephalic and atraumatic.  Eyes: EOM are normal. Pupils are equal, round, and reactive to light.  Neck: Neck supple.  Cardiovascular: Normal rate, regular rhythm, normal heart sounds and intact distal pulses.   Pulmonary/Chest: Effort normal and breath sounds normal.  Abdominal: Soft. Bowel sounds are normal. He exhibits no distension. There is no tenderness.  Musculoskeletal: Normal range of motion. He exhibits no edema or tenderness.  Neurological: He is alert and oriented to person, place, and time. He has normal strength. Coordination normal. GCS eye subscore is 4. GCS verbal subscore is 5. GCS motor subscore is 6.  Skin: Skin is warm, dry and intact.  Psychiatric: He has a normal mood and affect.    ED Course  Procedures (including critical care time) Labs Review Labs Reviewed  CBC - Abnormal; Notable for the following:    MCH 24.5 (*)    All other components within normal limits  BASIC METABOLIC PANEL  I-STAT TROPOININ, ED    Imaging Review Dg Chest 2 View  01/05/2015  CLINICAL DATA:  Right-sided chest pain EXAM: CHEST  2 VIEW COMPARISON:  August 29, 2007 FINDINGS: Lungs are clear. Heart size and pulmonary vascularity are normal. No adenopathy. No pneumothorax.  No bone lesions. IMPRESSION: No edema or consolidation. Electronically Signed   By: Bretta BangWilliam  Woodruff III M.D.   On: 01/05/2015 09:24   I have personally reviewed and evaluated these images and lab results as part of my medical decision-making.   EKG Interpretation   Date/Time:  Friday January 05 2015 09:06:51 EST Ventricular Rate:  92 PR Interval:  167 QRS Duration: 89 QT Interval:  342 QTC Calculation: 423 R Axis:   64 Text Interpretation:  Sinus rhythm normal Confirmed by Donnald GarrePfeiffer, MD, Lebron ConnersMarcy   [phone]([id number]) on 01/05/2015 10:38:14 AM      MDM   Final diagnoses:  Chest pain, unspecified chest pain type   Patient is well in appearance. Only cardiac risk factors smoking. Patient had a sudden right, localized chest pain at the sternocostal junction at approximately the third rib. This resolved spontaneously. It occurred with rolling over in bed. At this time I find he is very low risk for ischemic or pulmonary disease. Workup is negative. Patient is given precautionary instructions and counseled on smoking cessation.    Arby BarretteMarcy Japheth Diekman, MD 01/05/15 1106

## 2015-01-05 NOTE — ED Notes (Signed)
MD at bedside. 

## 2015-01-05 NOTE — Discharge Instructions (Signed)
Chest Wall Pain °Chest wall pain is pain in or around the bones and muscles of your chest. Sometimes, an injury causes this pain. Sometimes, the cause may not be known. This pain may take several weeks or longer to get better. °HOME CARE INSTRUCTIONS  °Pay attention to any changes in your symptoms. Take these actions to help with your pain:  °· Rest as told by your health care provider.   °· Avoid activities that cause pain. These include any activities that use your chest muscles or your abdominal and side muscles to lift heavy items.    °· If directed, apply ice to the painful area: °¨ Put ice in a plastic bag. °¨ Place a towel between your skin and the bag. °¨ Leave the ice on for 20 minutes, 2-3 times per day. °· Take over-the-counter and prescription medicines only as told by your health care provider. °· Do not use tobacco products, including cigarettes, chewing tobacco, and e-cigarettes. If you need help quitting, ask your health care provider. °· Keep all follow-up visits as told by your health care provider. This is important. °SEEK MEDICAL CARE IF: °· You have a fever. °· Your chest pain becomes worse. °· You have new symptoms. °SEEK IMMEDIATE MEDICAL CARE IF: °· You have nausea or vomiting. °· You feel sweaty or light-headed. °· You have a cough with phlegm (sputum) or you cough up blood. °· You develop shortness of breath. °  °This information is not intended to replace advice given to you by your health care provider. Make sure you discuss any questions you have with your health care provider. °  °Document Released: 01/20/2005 Document Revised: 10/11/2014 Document Reviewed: 04/17/2014 °Elsevier Interactive Patient Education ©2016 Elsevier Inc. ° ° ° ° ° ° °Emergency Department Resource Guide °1) Find a Doctor and Pay Out of Pocket °Although you won't have to find out who is covered by your insurance plan, it is a good idea to ask around and get recommendations. You will then need to call the office and  see if the doctor you have chosen will accept you as a new patient and what types of options they offer for patients who are self-pay. Some doctors offer discounts or will set up payment plans for their patients who do not have insurance, but you will need to ask so you aren't surprised when you get to your appointment. ° °2) Contact Your Local Health Department °Not all health departments have doctors that can see patients for sick visits, but many do, so it is worth a call to see if yours does. If you don't know where your local health department is, you can check in your phone book. The CDC also has a tool to help you locate your state's health department, and many state websites also have listings of all of their local health departments. ° °3) Find a Walk-in Clinic °If your illness is not likely to be very severe or complicated, you may want to try a walk in clinic. These are popping up all over the country in pharmacies, drugstores, and shopping centers. They're usually staffed by nurse practitioners or physician assistants that have been trained to treat common illnesses and complaints. They're usually fairly quick and inexpensive. However, if you have serious medical issues or chronic medical problems, these are probably not your best option. ° °No Primary Care Doctor: °- Call [facility] at  832-8000 - they can help you locate a primary care doctor that  accepts your insurance, provides certain services,   etc. °- Physician Referral Service- 1-800-533-3463 ° °Chronic Pain Problems: °Organization         Address  Phone   Notes  °[facility]  (336) 297-2271 Patients need to be referred by their primary care doctor.  ° °Medication Assistance: °Organization         Address  Phone   Notes  °[place] [facility] [address] °[place], [place] [address] (336) 641-8030 --Must be a resident of [place] °-- Must have NO insurance coverage whatsoever  (no Medicaid/ Medicare, etc.) °-- The pt. MUST have a primary care doctor that directs their care regularly and follows them in the community °  °[facility]  (866) 331-1348   °[employer]  (888) 892-1162   ° °Agencies that provide inexpensive medical care: °Organization         Address  Phone   Notes  °[facility]  (336) 832-8035   °[facility]    (336) 832-7272   °[facility] [address] °[place], [place] [address] (336) 832-4777   °[facility] [address], °[place] (336) 271-4999   °[facility]    (336) 373-0678   °[facility]    (336) 272-1050   °[facility] ° [address], [place] Phone:  (336) 832-4444, Fax:  (336) 832-4440 Hours of Operation:  9 am - 6 pm, M-F.  Also accepts Medicaid/Medicare and self-pay.  ° [facility] ° [address], [place] Phone: (336) 832-3150, Fax: (336) 832-3151. Hours of Operation:  8:30 am - 5:30 pm, M-F.  Also accepts Medicaid and self-pay.  °[facility] [place] [address], [place] Phone: (336) 878-6027   °[facility] [address], [place], [place] (336)723-1848, Ext. 123 Mondays & Thursdays: 7-9 AM.  First 15 patients are seen on a first come, first serve basis. °  ° °Medicaid-accepting [place] Providers: ° °Organization         Address  Phone   Notes  °[facility] [address], [place] (336) 641-2100 Also accepts self-pay patients.  °[facility] [address], [place] ° (336) 856-9996   °[facility] [address], [place] (336) 288-8857   °[facility] [address] [place] [address], [place] (336) 299-7000   °Veita Bland [address], [place]  ° (336) 373-1557 Only accepts [place] Access Medicaid patients after they have their name applied to their  card.  ° °Self-Pay (no insurance) in [place]: ° °Organization         Address  Phone   Notes  °Sickle Cell Patients, [facility] [address], [place] (336) 832-1970   °[facility] [address], [place] (336) 832-4400   ° [facility] ° [address], [place] (336) 992-4800   °[facility]/Dr. Osei-Bonsu ° [address] [place] [address], [place] or [address], [place] (336) 841-8500 Phone number for both [place] and [place] locations is the same.  °[facility] [address], [place] (336) 299-0000   °[facility] [address] [place] [address], [place] or [address] (336) 852-7530 °(336) 878-2260   °[facility] [address], [place] (  336) 350-1642, phone; (336) 294-5005, fax Sees patients 1st and 3rd Saturday of every month.  Must not qualify for public or private insurance (i.e. Medicaid, Medicare, [place] Health Choice, Veterans' Benefits) • Household income should be no more than 200% of the poverty level •The clinic cannot treat you if you are pregnant or think you are pregnant • Sexually transmitted diseases are not treated at the clinic.  ° ° °Dental Care: °Organization         Address  Phone  Notes  °[place] [facility] [address], [place] (336) 641-6152 Accepts children up to age 21 who are enrolled in Medicaid or [place] Health Choice; pregnant women with a Medicaid card; and children who have applied for Medicaid or [place] Health Choice, but were declined, whose parents can pay a reduced fee at time of service.  °[facility] [place]  [address], [place] (336) 641-7733 Accepts children up to age 21 who are enrolled in Medicaid or [place] Health Choice; pregnant women with a Medicaid card; and children who have applied for Medicaid or [place] Health  Choice, but were declined, whose parents can pay a reduced fee at time of service.  °[facility] ° [address], [place] (336) 641-4533 Patients are seen by appointment only. Walk-ins are not accepted. [facility] will see patients 18 years of age and older. °Monday - Tuesday (8am-5pm) °Most Wednesdays (8:30-5pm) °$30 per visit, cash only  °[facility] ° [address], [place] (336) 641-4533 Patients are seen by appointment only. Walk-ins are not accepted. [facility] will see patients 18 years of age and older. °One Wednesday Evening (Monthly: Volunteer Based).  $30 per visit, cash only  °[facility]  (919) 537-3737 for adults; Children under age 4, call Graduate Pediatric Dentistry at (919) [phone]. Children aged 4-14, please call (919) [phone] to request a pediatric application. ° Dental services are provided in all areas of dental care including fillings, crowns and bridges, complete and partial dentures, implants, gum treatment, root canals, and extractions. Preventive care is also provided. Treatment is provided to both adults and children. °Patients are selected via a lottery and there is often a waiting list. °  °[facility] [address], °[place] ° (336) 763-8833 [email or web address] °  °[facility] [address], [place], [place] (336)723-1848, Ext. 123 Second and Fourth Thursday of each month, opens at 6:30 AM; Clinic ends at 9 AM.  Patients are seen on a first-come first-served basis, and a limited number are seen during each clinic.  ° °[facility] ° [address], [place], [place] (336) 723-7904   Eligibility Requirements °You must have lived in [place], [place], or [place] counties for at least the last three months. °  You cannot be eligible for state or federal sponsored healthcare insurance, including Veterans Administration, Medicaid, or Medicare. °  You  generally cannot be eligible for healthcare insurance through your employer.  °  How to apply: °Eligibility screenings are held every Tuesday and Wednesday afternoon from 1:00 pm until 4:00 pm. You do not need an appointment for the interview!  °[facility] [address], [place], [place] 336-631-2330   °[facility]  336-342-8273   °[facility]  336-703-3100   °[facility]  336-570-6415   ° °Behavioral Health   Resources in the Community: °Intensive Outpatient Programs °Organization         Address  Phone  Notes  °[facility] [address], [place], [place] 336-878-6098   °[facility] [address], [place], [place] 336-832-9800   °[facility] [address], [place], [place] ° 336-882-2125   °[facility] [address],  °[place], [place] 1-800-853-5163 or 336-641-4981   °Substance Abuse Resources °Organization         Address  Phone  Notes  °[facility]  336-882-2125   °[facility]  336-784-9470   °[facility]  336-285-9073   °[facility]  336-845-3988   °Residential & Outpatient Substance Abuse Program  1-800-659-3381   °Psychological Services °Organization         Address  Phone  Notes  °[facility]  336- 832-9600   °[facility]  336- 378-7881   °[facility] [address], [place] 1-800-853-5163 or 336-641-4981   ° °Mobile Crisis Teams °Organization         Address  Phone  Notes  °[facility], Mobile Crisis Care Unit  1-877-626-1772   °Assertive °[facility] ° [address] [place], [place] 336-834-9664   °Sharon DeEsch [address] °[place] 336-554-5454   ° °Self-Help/Support Groups °Organization         Address  Phone             Notes  °[facility] - variety of support groups  336- 373-1402 Call for  more information  °Narcotics Anonymous (NA), [facility] [address], °[place]  2 meetings at this location  ° °Residential Treatment Programs °Organization         Address  Phone  Notes  °[facility] [address],    °[place]  1-866-801-8205   °[facility] ° [address], [place], [place] 704-293-8524   °[facility] [address], [place] 336-845-3988 Admissions: 8am-3pm M-F  °[facility] [address]    °[place], [place] 336-841-1104   °[facility] [address], [place], [place] 336-379-7146   °[facility] [address]  °[place], [place] 336-285-9073   °[facility] - Intensive Outpatient [address], [place], [place] 336-852-3033   °[facility] [address]  °[place], [place] 1-877-615-2722 or 336-784-9470   °[facility] [address] [place], [place] 336-227-7417 Accepts Medicaid  °[facility] [address]  °[place] 1-800-659-3381 Substance Abuse/Addiction Treatment  ° °[place] [facility] Resources °Organization         Address  Phone  Notes  °[facility]  (888) 581-9988   °Julie Brannon, PhD [address] [place], [place]   (336) 349-5553 or (336) 951-0000   °[facility]   [address] °[place], [place] (336) 349-4454   °[facility] [address], [place], [place] (336) 342-8316 Insurance/Medicaid/sponsorship through [facility]  °[facility] [address]                                    [place], [place] (336) 342-8316 Therapy/tele-psych/case  °[facility] [address]  ° [place], [place] (336)   349-2233    °Dr. Arfeen  (336) 349-4544   °[facility] [place]  [employer] [place] [facility] 1) [address], [place] °2) [address], [place] °3)  [address], [place] (336)  349-3220 °(336) 342-7768 ° °(336) 342-8140   °[place] [facility] (336) 342-1394 or (336) 342-3537 (After Hours)    ° °  °

## 2015-01-05 NOTE — ED Notes (Signed)
Per pt, had back pain this morning.  Rolled over and started having pain in rt upper chest.  Has had constant cough x (since weather changed).  Denies fever, shortness of breath or arm pain.  Works as a [employer] architecttruck driver.   Job is physical.

## 2015-01-10 ENCOUNTER — Emergency Department (HOSPITAL_COMMUNITY)
Admission: EM | Admit: 2015-01-10 | Discharge: 2015-01-10 | Disposition: A | Discharge status: Home / Self Care | Payer: Worker's Compensation | Attending: Emergency Medicine | Admitting: Emergency Medicine

## 2015-01-10 ENCOUNTER — Emergency Department (HOSPITAL_COMMUNITY): Payer: Worker's Compensation

## 2015-01-10 ENCOUNTER — Encounter (HOSPITAL_COMMUNITY): Payer: Self-pay

## 2015-01-10 DIAGNOSIS — Z8679 Personal history of other diseases of the circulatory system: Secondary | ICD-10-CM | POA: Insufficient documentation

## 2015-01-10 DIAGNOSIS — Y9289 Other specified places as the place of occurrence of the external cause: Secondary | ICD-10-CM | POA: Insufficient documentation

## 2015-01-10 DIAGNOSIS — Y998 Other external cause status: Secondary | ICD-10-CM | POA: Insufficient documentation

## 2015-01-10 DIAGNOSIS — Z79899 Other long term (current) drug therapy: Secondary | ICD-10-CM | POA: Insufficient documentation

## 2015-01-10 DIAGNOSIS — F17210 Nicotine dependence, cigarettes, uncomplicated: Secondary | ICD-10-CM | POA: Insufficient documentation

## 2015-01-10 DIAGNOSIS — S0990XA Unspecified injury of head, initial encounter: Secondary | ICD-10-CM

## 2015-01-10 DIAGNOSIS — W01198A Fall on same level from slipping, tripping and stumbling with subsequent striking against other object, initial encounter: Secondary | ICD-10-CM | POA: Insufficient documentation

## 2015-01-10 DIAGNOSIS — S0003XA Contusion of scalp, initial encounter: Secondary | ICD-10-CM | POA: Insufficient documentation

## 2015-01-10 DIAGNOSIS — Y9389 Activity, other specified: Secondary | ICD-10-CM | POA: Insufficient documentation

## 2015-01-10 MED ORDER — IBUPROFEN 200 MG PO TABS
600.0000 mg | ORAL_TABLET | Freq: Once | ORAL | Status: AC
  Administered 2015-01-10: 600 mg via ORAL
  Filled 2015-01-10: qty 3

## 2015-01-10 NOTE — ED Notes (Signed)
Patient states he was standing by a car and a tote fell off the top of a car and hit him in the right lower posterior head. Patient states he took Tylenol with no relief and continues to have a headache. Patient denies dizziness and blurred vision. MAE.

## 2015-01-10 NOTE — ED Provider Notes (Signed)
CSN: [id number]     Arrival date & time 01/10/15  1249 History  By signing my name below, I, Keith Blackwell, attest that this documentation has been prepared under the direction and in the presence of non-physician practitioner, Keith Lower, NP. Electronically Signed: Freida Blackwell, Scribe. 01/10/2015. 1:06 PM.    Chief Complaint  Patient presents with  . Head Injury    The history is provided by the patient. No language interpreter was used.   HPI Comments:  Keith Blackwell is a 29 y.o. male who presents to the Emergency Department complaining of 10/10 HA following head injury ~1200 this afternoon. Pt states a tote fell off a stacked cart and struck him in the head. He denies LOC. Pt states his pain is located to his right posterior head. No alleviating factors noted. He also denies dizziness and blurry vision at this time.   Past Medical History  Diagnosis Date  . Migraine   . Environmental allergies    Past Surgical History  Procedure Laterality Date  . Hernia repair     History reviewed. No pertinent family history. Social History  Substance Use Topics  . Smoking status: Current Every Day Smoker -- 1.00 packs/day    Types: Cigarettes  . Smokeless tobacco: None  . Alcohol Use: No    Review of Systems  Constitutional: Negative for fever and chills.  Eyes: Negative for visual disturbance.  Respiratory: Negative for shortness of breath.   Cardiovascular: Negative for chest pain.  Neurological: Positive for headaches. Negative for dizziness, syncope and weakness.    Allergies  Review of patient's allergies indicates no known allergies.  Home Medications   Prior to Admission medications   Medication Sig Start Date End Date Taking? Authorizing Provider  cetirizine (ZYRTEC) 10 MG tablet Take 10 mg by mouth daily.    Historical Provider, MD  ibuprofen (ADVIL,MOTRIN) 600 MG tablet Take 1 tablet (600 mg total) by mouth every 6 (six) hours as needed. 01/05/15   Arby Barrette, MD  terbinafine (LAMISIL AT) 1 % cream Apply 1 application topically 2 (two) times daily. Patient not taking: Reported on 01/05/2015 12/05/14   Arthor Captain, PA-C   BP 136/85 mmHg  Pulse 100  Temp(Src) 97.9 F (36.6 C) (Oral)  Resp 14  SpO2 100% Physical Exam  Constitutional: He is oriented to person, place, and time. He appears well-developed and well-nourished. No distress.  HENT:  Right Ear: External ear normal.  Left Ear: External ear normal.  Mouth/Throat: Oropharynx is clear and moist.  Tenderness with hematoma noted to the right posterior scalp  Eyes: Conjunctivae and EOM are normal. Pupils are equal, round, and reactive to light.  Neck: Normal range of motion. Neck supple.  Cardiovascular: Normal rate.   Pulmonary/Chest: Effort normal and breath sounds normal. He has no wheezes.  Abdominal: He exhibits no distension.  Musculoskeletal: Normal range of motion. He exhibits no tenderness.  Strength and sensation in all extremities.  Neurological: He is alert and oriented to person, place, and time. He exhibits normal muscle tone. Coordination normal.  Skin: Skin is warm and dry.  Psychiatric: He has a normal mood and affect.  Nursing note and vitals reviewed.   ED Course  Procedures   DIAGNOSTIC STUDIES:  Oxygen Saturation is 100% on RA, normal by my interpretation.    COORDINATION OF CARE:  1:04 PM Will order head CT. Discussed treatment plan with pt at bedside and pt agreed to plan.  Labs Review Labs Reviewed - No  data to display  Imaging Review Ct Head Wo Contrast  01/10/2015  CLINICAL DATA:  Patient states standing beside car and hit on head with heavy object. EXAM: CT HEAD WITHOUT CONTRAST TECHNIQUE: Contiguous axial images were obtained from the base of the skull through the vertex without intravenous contrast. COMPARISON:  None. FINDINGS: No evidence for acute infarction, hemorrhage, mass lesion, hydrocephalus, or extra-axial fluid. Normal cerebral  volume. No white matter disease. Cavum septum pellucidum. Calvarium intact. Scalp soft tissues unremarkable. Negative orbits, sinuses, and mastoids. IMPRESSION: Negative exam. Electronically Signed   By: Elsie StainJohn T Curnes M.D.   On: 01/10/2015 14:14   I have personally reviewed and evaluated these images as part of my medical decision-making.   EKG Interpretation None      MDM   Final diagnoses:  Head injury, initial encounter    No brain or skull injury noted. Pt given head injury instructions with return precautions. Pt is neurologically intact  I personally performed the services described in this documentation, which was scribed in my presence. The recorded information has been reviewed and is accurate.    Keith LowerVrinda Kayle Correa, NP 01/10/15 1419  Mancel BaleElliott [place], MD 01/10/15 [phone]

## 2015-01-10 NOTE — Discharge Instructions (Signed)
Follow up with your doctor or return here for continued or worsening symptoms. Head Injury, Adult You have a head injury. Headaches and throwing up (vomiting) are common after a head injury. It should be easy to wake up from sleeping. Sometimes you must stay in the hospital. Most problems happen within the first 24 hours. Side effects may occur up to 7-10 days after the injury.  WHAT ARE THE TYPES OF HEAD INJURIES? Head injuries can be as minor as a bump. Some head injuries can be more severe. More severe head injuries include:  A jarring injury to the brain (concussion).  A bruise of the brain (contusion). This mean there is bleeding in the brain that can cause swelling.  A cracked skull (skull fracture).  Bleeding in the brain that collects, clots, and forms a bump (hematoma). WHEN SHOULD I GET HELP RIGHT AWAY?   You are confused or sleepy.  You cannot be woken up.  You feel sick to your stomach (nauseous) or keep throwing up (vomiting).  Your dizziness or unsteadiness is getting worse.  You have very bad, lasting headaches that are not helped by medicine. Take medicines only as told by your doctor.  You cannot use your arms or legs like normal.  You cannot walk.  You notice changes in the black spots in the center of the colored part of your eye (pupil).  You have clear or bloody fluid coming from your nose or ears.  You have trouble seeing. During the next 24 hours after the injury, you must stay with someone who can watch you. This person should get help right away (call 911 in the [place] if you start to shake and are not able to control it (have seizures), you pass out, or you are unable to wake up. HOW CAN I PREVENT A HEAD INJURY IN THE FUTURE?  Wear seat belts.  Wear a helmet while bike riding and playing sports like football.  Stay away from dangerous activities around the house. WHEN CAN I RETURN TO NORMAL ACTIVITIES AND ATHLETICS? See your doctor before doing  these activities. You should not do normal activities or play contact sports until 1 week after the following symptoms have stopped:  Headache that does not go away.  Dizziness.  Poor attention.  Confusion.  Memory problems.  Sickness to your stomach or throwing up.  Tiredness.  Fussiness.  Bothered by bright lights or loud noises.  Anxiousness or depression.  Restless sleep. MAKE SURE YOU:   Understand these instructions.  Will watch your condition.  Will get help right away if you are not doing well or get worse.   This information is not intended to replace advice given to you by your health care provider. Make sure you discuss any questions you have with your health care provider.   Document Released: 01/03/2008 Document Revised: 02/10/2014 Document Reviewed: 09/27/2012 Elsevier Interactive Patient Education [employer]2016 Elsevier Inc.

## 2015-10-31 ENCOUNTER — Encounter (HOSPITAL_COMMUNITY): Payer: Self-pay

## 2015-10-31 ENCOUNTER — Emergency Department (HOSPITAL_COMMUNITY)
Admission: EM | Admit: 2015-10-31 | Discharge: 2015-10-31 | Disposition: A | Discharge status: Home / Self Care | Payer: Self-pay | Attending: Emergency Medicine | Admitting: Emergency Medicine

## 2015-10-31 DIAGNOSIS — F17210 Nicotine dependence, cigarettes, uncomplicated: Secondary | ICD-10-CM | POA: Insufficient documentation

## 2015-10-31 DIAGNOSIS — J069 Acute upper respiratory infection, unspecified: Secondary | ICD-10-CM | POA: Insufficient documentation

## 2015-10-31 LAB — RAPID STREP SCREEN (MED CTR MEBANE ONLY): STREPTOCOCCUS, GROUP A SCREEN (DIRECT): NEGATIVE

## 2015-10-31 MED ORDER — FLUTICASONE PROPIONATE 50 MCG/ACT NA SUSP: 1.0000 | Freq: Every day | NASAL | 0 refills | Status: DC

## 2015-10-31 MED ORDER — BENZONATATE 100 MG PO CAPS
100.0000 mg | ORAL_CAPSULE | Freq: Once | ORAL | Status: AC
  Administered 2015-10-31: 100 mg via ORAL
  Filled 2015-10-31: qty 1

## 2015-10-31 MED ORDER — BENZONATATE 100 MG PO CAPS: 100.0000 mg | ORAL_CAPSULE | Freq: Three times a day (TID) | ORAL | 0 refills | Status: DC

## 2015-10-31 NOTE — ED Triage Notes (Signed)
Pt complains of an URI since Monday

## 2015-10-31 NOTE — ED Notes (Signed)
Patient was alert, oriented and stable upon discharge. RN went over AVS and patient had no further questions.  

## 2015-10-31 NOTE — ED Provider Notes (Signed)
[facility]-EMERGENCY DEPT Provider Note   CSN: [id number] Arrival date & time: 10/31/15  1919  By signing my name below, I, Modena JanskyAlbert Thayil, attest that this documentation has been prepared under the direction and in the presence of non-physician practitioner, Terance HartKelly Rana Hochstein, PA-C. Electronically Signed: Modena JanskyAlbert Thayil, Scribe. 10/31/2015. 8:21 PM.  History   Chief Complaint Chief Complaint  Patient presents with  . URI   The history is provided by the patient. No language interpreter was used.   HPI Comments: Keith Blackwell is a 30 y.o. male who presents to the Emergency Department complaining of constant moderate sore throat that started 2 days ago ago. Pt states he has been feeling sick lately. Associated symptoms include nasal/chest congestion, subjective fever, chills, diaphoresis, generalized myalgias, cough productive of yellow sputum, vomiting (one episode yesterday), and decreased appetite. He took theraflu and Mucinex with some relief. Pt's temperature in the ED today was 98.8. Reports a hx of smoking. Denies any other complaints.   Past Medical History:  Diagnosis Date  . Environmental allergies   . Migraine     There are no active problems to display for this patient.   Past Surgical History:  Procedure Laterality Date  . HERNIA REPAIR       Home Medications    Prior to Admission medications   Medication Sig Start Date End Date Taking? Authorizing Provider  cetirizine (ZYRTEC) 10 MG tablet Take 10 mg by mouth daily.    Historical Provider, MD  ibuprofen (ADVIL,MOTRIN) 600 MG tablet Take 1 tablet (600 mg total) by mouth every 6 (six) hours as needed. 01/05/15   Arby BarretteMarcy Pfeiffer, MD  terbinafine (LAMISIL AT) 1 % cream Apply 1 application topically 2 (two) times daily. Patient not taking: Reported on 01/05/2015 12/05/14   Arthor CaptainAbigail Harris, PA-C    Family History History reviewed. No pertinent family history.  Social History Social History  Substance Use Topics  . Smoking  status: Current Every Day Smoker    Packs/day: 1.00    Types: Cigarettes  . Smokeless tobacco: Never Used  . Alcohol use No     Allergies   Review of patient's allergies indicates no known allergies.   Review of Systems Review of Systems  Constitutional: Positive for appetite change, diaphoresis and fever (Subjective).  HENT: Positive for congestion and sore throat.   Respiratory: Positive for cough and chest tightness.   Gastrointestinal: Positive for vomiting.  Musculoskeletal: Positive for myalgias (Generalized).     Physical Exam Updated Vital Signs BP 159/99 (BP Location: Right Arm)   Pulse 112   Temp 98.8 F (37.1 C) (Oral)   Resp 18   SpO2 95%   Physical Exam  Constitutional: He is oriented to person, place, and time. He appears well-developed and well-nourished. No distress.  Overall well appearing  HENT:  Head: Normocephalic and atraumatic.  Right Ear: Hearing, tympanic membrane, external ear and ear canal normal.  Left Ear: Hearing, tympanic membrane, external ear and ear canal normal.  Nose: No mucosal edema or rhinorrhea.  Mouth/Throat: Uvula is midline and mucous membranes are normal. Posterior oropharyngeal erythema present. No oropharyngeal exudate, posterior oropharyngeal edema or tonsillar abscesses.  Eyes: Conjunctivae are normal. Pupils are equal, round, and reactive to light. Right eye exhibits no discharge. Left eye exhibits no discharge. No scleral icterus.  Neck: Normal range of motion. Neck supple.  Cardiovascular: Normal rate and regular rhythm.  Exam reveals no gallop and no friction rub.   No murmur heard. Pulmonary/Chest: Effort normal and breath  sounds normal. No respiratory distress. He has no wheezes. He has no rales. He exhibits no tenderness.  Abdominal: Soft. Bowel sounds are normal. He exhibits no distension. There is no tenderness.  Musculoskeletal: He exhibits no edema.  Neurological: He is alert and oriented to person, place, and  time.  Skin: Skin is warm and dry.  Psychiatric: He has a normal mood and affect.  Nursing note and vitals reviewed.    ED Treatments / Results  DIAGNOSTIC STUDIES: Oxygen Saturation is 95% on RA, normal by my interpretation.    COORDINATION OF CARE: 8:28 PM- Pt advised of plan for treatment and pt agrees.  Labs (all labs ordered are listed, but only abnormal results are displayed) Labs Reviewed  RAPID STREP SCREEN (NOT AT [facility])  CULTURE, GROUP A STREP [facility])    EKG  EKG Interpretation None       Radiology No results found.  Procedures Procedures (including critical care time)  Medications Ordered in ED Medications  benzonatate (TESSALON) capsule 100 mg (100 mg Oral Given 10/31/15 2032)     Initial Impression / Assessment and Plan / ED Course  I have reviewed the triage vital signs and the nursing notes.  Pertinent labs & imaging results that were available during my care of the patient were reviewed by me and considered in my medical decision making (see chart for details).  Clinical Course   30 year old male presents with URI. Patient is afebrile, not tachypneic, normotensive, and not hypoxic. He is initially tachycardic in triage however on exam his rate is normal. Rapid strep is negative. Tessalon given in ED and patient reports improvement in cough. Rx for flonase and tessalon given. Advised supportive care. Patient is NAD, non-toxic, with stable VS. Patient is informed of clinical course, understands medical decision making process, and agrees with plan. Opportunity for questions provided and all questions answered. Return precautions given.   Final Clinical Impressions(s) / ED Diagnoses   Final diagnoses:  URI (upper respiratory infection)    New Prescriptions Discharge Medication List as of 10/31/2015  9:24 PM    START taking these medications   Details  benzonatate (TESSALON) 100 MG capsule Take 1 capsule (100 mg total) by mouth every 8 (eight)  hours., Starting Wed 10/31/2015, Print    fluticasone (FLONASE) 50 MCG/ACT nasal spray Place 1 spray into both nostrils daily., Starting Wed 10/31/2015, Print       I personally performed the services described in this documentation, which was scribed in my presence. The recorded information has been reviewed and is accurate.    Bethel Born, PA-C 10/31/15 2158    Pricilla Loveless, MD 11/05/15 [phone]

## 2015-10-31 NOTE — Discharge Instructions (Signed)
Please drink plenty of fluids. Continue using Theraflu. Your symptoms could last up to one week If your symptoms are not improving in a week, please come back to the ED or Urgent care to be re-evaluated A prescription for Flonase and Tessalon have been provided for your symptoms.

## 2015-11-03 LAB — CULTURE, GROUP A STREP (THRC)

## 2016-01-22 ENCOUNTER — Emergency Department (HOSPITAL_COMMUNITY)
Admission: EM | Admit: 2016-01-22 | Discharge: 2016-01-22 | Disposition: A | Discharge status: Home / Self Care | Payer: Self-pay | Attending: Emergency Medicine | Admitting: Emergency Medicine

## 2016-01-22 ENCOUNTER — Emergency Department (HOSPITAL_COMMUNITY): Payer: Self-pay

## 2016-01-22 ENCOUNTER — Encounter (HOSPITAL_COMMUNITY): Payer: Self-pay

## 2016-01-22 DIAGNOSIS — F17210 Nicotine dependence, cigarettes, uncomplicated: Secondary | ICD-10-CM | POA: Insufficient documentation

## 2016-01-22 DIAGNOSIS — M79674 Pain in right toe(s): Secondary | ICD-10-CM

## 2016-01-22 DIAGNOSIS — B353 Tinea pedis: Secondary | ICD-10-CM | POA: Insufficient documentation

## 2016-01-22 DIAGNOSIS — Z79899 Other long term (current) drug therapy: Secondary | ICD-10-CM | POA: Insufficient documentation

## 2016-01-22 MED ORDER — CLOTRIMAZOLE 1 % EX CREA: TOPICAL_CREAM | CUTANEOUS | 0 refills | Status: DC

## 2016-01-22 MED ORDER — TERBINAFINE HCL 250 MG PO TABS: 250.0000 mg | ORAL_TABLET | Freq: Every day | ORAL | 0 refills | Status: AC

## 2016-01-22 NOTE — ED Triage Notes (Signed)
PT C/O RIGHT 5TH TOE PAIN AND SWELLING ON AND OFF X3 WEEKS. DENIES INJURY.

## 2016-01-22 NOTE — Discharge Instructions (Signed)
Your x-ray today was negative.   Your symptoms are most consistent with a fungal infection called tinea pedis.  Please read the attached information about tinea pedis.    You are being prescribed an anti-fungal medication, terbinafine.  Please take this medication as prescribed for two weeks.    You are also being prescribed an anti-fungal cream, clotrimazole, please apply this to affected skin as instructed.    You may take ibuprofen or tylenol every 6 hours (three times a day) for pain.   It is important that you follow up with a primary care provider, [facility] to set up an appointment with a primary care provider.  Your primary care provider can manage your medications and can refer you to a dermatologist (skin doctor) as needed.   Your fungal infection may take weeks to start clearing up.  Follow [facility] with a [facility] care provider[facility], as soon as possible so you are able to get re-evaluated.

## 2016-01-23 NOTE — ED Provider Notes (Signed)
[facility]-EMERG[facility]Y DEPT Provider Note   CSN: [id number] Arrival date & time: 01/22/16  1946     History   Chief Complaint Chief Complaint  Patient presents with  . Toe Pain    HPI Keith Blackwell is a 30 y.o. male with pmh of migraine who presents to ED for R pinky toe sharp pain x 3 weeks.  Pt denies trauma to pinky toe.  Pt states wearing closed shoes and palpation exacerbates the pain.  No alleviating factors.  Pt has tried soaking his foot in epson salt.  Pt has not tried taking ibuprofen or tylenol.  Pt denies h/o gout, h/o DM. Pt denies fevers, IVDU.    HPI  Past Medical History:  Diagnosis Date  . Environmental allergies   . Migraine     There are no active problems to display for this patient.   Past Surgical History:  Procedure Laterality Date  . HERNIA REPAIR         Home Medications    Prior to Admission medications   Medication Sig Start Date End Date Taking? Authorizing Provider  benzonatate (TESSALON) 100 MG capsule Take 1 capsule (100 mg total) by mouth every 8 (eight) hours. 10/31/15   Bethel BornKelly Marie Gekas, PA-C  cetirizine (ZYRTEC) 10 MG tablet Take 10 mg by mouth daily.    Historical Provider, MD  clotrimazole (LOTRIMIN) 1 % cream Apply to affected area 2 times daily 01/22/16   Liberty Handylaudia J Shyia Fillingim, PA-C  fluticasone [facility](FLONASE) 50 MCG/ACT nasal spray Place 1 spray into both nostrils daily. 10/31/15   Bethel BornKelly Marie Gekas, PA-C  ibuprofen (ADVIL,MOTRIN) 600 MG tablet Take 1 tablet (600 mg total) by mouth every 6 (six) hours as needed. 01/05/15   Arby BarretteMarcy Pfeiffer, MD  terbinafine (LAMISIL AT) 1 % cream Apply 1 application topically 2 (two) times daily. Patient not taking: Reported on 01/05/2015 12/05/14   Arthor CaptainAbigail Harris, PA-C  terbinafine (LAMISIL) 250 MG tablet Take 1 tablet (250 mg total) by mouth daily. 01/22/16 02/05/16  Liberty Handylaudia J Chancey Ringel, PA-C    Family History History reviewed. No pertinent family history.  Social History Social History  Substance Use Topics    . Smoking status: Current Every Day Smoker    Packs/day: 1.00    Types: Cigarettes  . Smokeless tobacco: Never Used  . Alcohol use No     Allergies   Patient has no known allergies.   Review of Systems Review of Systems  Constitutional: Negative for fever.  Respiratory: Negative for cough and shortness of breath.   Cardiovascular: Negative for chest pain, palpitations and leg swelling.  Gastrointestinal: Negative for abdominal pain and nausea.  Musculoskeletal: Positive for arthralgias. Negative for gait problem and joint swelling.  Skin: Negative for color change.  Neurological: Negative for headaches.  Hematological: Negative.   Psychiatric/Behavioral: Negative.      Physical Exam Updated Vital Signs BP 132/80   Pulse 87   Temp 98.5 F (36.9 C) (Oral)   Resp 16   Ht 5\' 10"  (1.778 m)   Wt 120.2 kg   SpO2 98%   BMI 38.02 kg/m   Physical Exam  Constitutional: He is oriented to person, place, and time. He appears well-developed and well-nourished. No distress.  Pt found seated on chair with socks and sandals on.  HENT:  Head: Normocephalic and atraumatic.  Mouth/Throat: Oropharynx is clear and moist.  Eyes: Conjunctivae and EOM are normal. Pupils are equal, round, and reactive to light.  Neck: Normal range of motion.  Cardiovascular:  Normal rate, regular rhythm and normal heart sounds.   No murmur heard. Pulmonary/Chest: Effort normal. He has no wheezes.  Musculoskeletal:  Full ROM at bilateral ankles and toes. No edema over R foot or toe joints.  Diffuse tenderness over R small toe, most significant at fourth interdigit space.   Neurological: He is alert and oriented to person, place, and time.  Skin: Skin is warm and dry. Capillary refill takes less than 2 seconds.  White/creamy thickened, moist skin present at bilateral fourth webspace, tender.  No erythema, edema, warmth, bleeding or discharge. Thickened, hyperpigmented nails at bilateral small toes.    Psychiatric: He has a normal mood and affect.   R fourth interdigital space    ED Treatments / Results  Labs (all labs ordered are listed, but only abnormal results are displayed) Labs Reviewed - No data to display  EKG  EKG Interpretation None       Radiology Dg Foot Complete Right  Result Date: 01/22/2016 CLINICAL DATA:  Pain to the right pinky toe for 3 weeks EXAM: RIGHT FOOT COMPLETE - 3+ VIEW COMPARISON:  12/05/2014 FINDINGS: There is no evidence of fracture or dislocation. There is no evidence of arthropathy or other focal bone abnormality. Soft tissues are unremarkable. IMPRESSION: Negative. Electronically Signed   By: Jasmine Pang M.D.   On: 01/22/2016 20:22    Procedures Procedures (including critical care time)  Medications Ordered in ED Medications - No data to display   Initial Impression / Assessment and Plan / ED Course  I have reviewed the triage vital signs and the nursing notes.  Pertinent labs & imaging results that were available during my care of the patient were reviewed by me and considered in my medical decision making (see chart for details).  Clinical Course    30 yo male presents with R pinky toe pain x 3 weeks.  On exam pt has white thickened, moist skin present at bilateral fourth webspaces, tender with thickened, hyperpigmented nails at R and L fifth toes. Exam findings most c/w with tinea pedis.  Triage ordered x-ray which was negative. Doubt gout, septic arthritis, cellulitis, fracture.  Pt will most likely benefit from oral antifungal over topical antifungal.  Pt uninsured and without PCP, pt concerned and requests prescription for antifungals until he is able to establish care with a PCP.  Pt denies kidney or liver problems.  Last creatinine on record wnl.  Discussed risks vs benefits of terbinafine tablets.  Pt verbalized understanding.  Pt instructed to keep feet dry. Pt given prescription for terbinafine PO x14 days only and clotrimazole  cream. Pt given [facility] information and instructed to make an appointment to establish care there.  Advised pt that we will not refill terfinafine PO, as this can be managed by a PCP at [facility].  Pt verbalized understanding, states he will follow up with [facility].  ED return instructions given.    Final Clinical Impressions(s) / ED Diagnoses   Final diagnoses:  Tinea pedis of both feet  Toe pain, right    New Prescriptions Discharge Medication List as of 01/22/2016 10:27 PM    START taking these medications   Details  clotrimazole (LOTRIMIN) 1 % cream Apply to affected area 2 times daily, Print    terbinafine (LAMISIL) 250 MG tablet Take 1 tablet (250 mg total) by mouth daily., Starting Tue 01/22/2016, Until Tue 02/05/2016, Print         Liberty Handy, PA-C 01/23/16 0110  Linwood DibblesJon Knapp, MD 01/23/16 [phone]

## 2016-03-29 ENCOUNTER — Emergency Department (HOSPITAL_COMMUNITY): Payer: Self-pay

## 2016-03-29 ENCOUNTER — Encounter (HOSPITAL_COMMUNITY): Payer: Self-pay | Admitting: Emergency Medicine

## 2016-03-29 ENCOUNTER — Emergency Department (HOSPITAL_COMMUNITY)
Admission: EM | Admit: 2016-03-29 | Discharge: 2016-03-29 | Disposition: A | Discharge status: Home / Self Care | Payer: Self-pay | Attending: Emergency Medicine | Admitting: Emergency Medicine

## 2016-03-29 DIAGNOSIS — Z79899 Other long term (current) drug therapy: Secondary | ICD-10-CM | POA: Insufficient documentation

## 2016-03-29 DIAGNOSIS — Y939 Activity, unspecified: Secondary | ICD-10-CM | POA: Insufficient documentation

## 2016-03-29 DIAGNOSIS — Y929 Unspecified place or not applicable: Secondary | ICD-10-CM | POA: Insufficient documentation

## 2016-03-29 DIAGNOSIS — Y999 Unspecified external cause status: Secondary | ICD-10-CM | POA: Insufficient documentation

## 2016-03-29 DIAGNOSIS — S60011A Contusion of right thumb without damage to nail, initial encounter: Secondary | ICD-10-CM | POA: Insufficient documentation

## 2016-03-29 DIAGNOSIS — W230XXA Caught, crushed, jammed, or pinched between moving objects, initial encounter: Secondary | ICD-10-CM | POA: Insufficient documentation

## 2016-03-29 DIAGNOSIS — S6991XA Unspecified injury of right wrist, hand and finger(s), initial encounter: Secondary | ICD-10-CM

## 2016-03-29 DIAGNOSIS — F17210 Nicotine dependence, cigarettes, uncomplicated: Secondary | ICD-10-CM | POA: Insufficient documentation

## 2016-03-29 MED ORDER — IBUPROFEN 800 MG PO TABS: 800.0000 mg | ORAL_TABLET | Freq: Three times a day (TID) | ORAL | 0 refills | Status: DC | PRN
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
## 2016-03-29 NOTE — Discharge Instructions (Signed)
Return here as needed.  Wear the splint for comfort.  Follow-up as needed with the orthopedist.  Ice and elevate your finger

## 2016-05-23 ENCOUNTER — Emergency Department (HOSPITAL_COMMUNITY)
Admission: EM | Admit: 2016-05-23 | Discharge: 2016-05-23 | Disposition: A | Discharge status: Home / Self Care | Payer: Self-pay | Attending: Emergency Medicine | Admitting: Emergency Medicine

## 2016-05-23 DIAGNOSIS — J029 Acute pharyngitis, unspecified: Secondary | ICD-10-CM

## 2016-05-23 DIAGNOSIS — B9789 Other viral agents as the cause of diseases classified elsewhere: Secondary | ICD-10-CM

## 2016-05-23 DIAGNOSIS — F17210 Nicotine dependence, cigarettes, uncomplicated: Secondary | ICD-10-CM | POA: Insufficient documentation

## 2016-05-23 DIAGNOSIS — Z79899 Other long term (current) drug therapy: Secondary | ICD-10-CM | POA: Insufficient documentation

## 2016-05-23 DIAGNOSIS — J069 Acute upper respiratory infection, unspecified: Secondary | ICD-10-CM | POA: Insufficient documentation

## 2016-05-23 LAB — RAPID STREP SCREEN (MED CTR MEBANE ONLY): Streptococcus, Group A Screen (Direct): NEGATIVE

## 2016-05-23 MED ORDER — PHENOL 1.4 % MT LIQD
1.0000 | OROMUCOSAL | Status: DC | PRN
  Administered 2016-05-23: 1 via OROMUCOSAL
  Filled 2016-05-23: qty 177
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
## 2016-05-23 NOTE — ED Triage Notes (Signed)
Pt repeated back proper use throat spray. Pt informed of negative step results. Encouraged to continue clear liquids

## 2016-05-23 NOTE — Discharge Instructions (Signed)
Continue to stay well-hydrated. Gargle warm salt water and spit it out. Use chloraseptic spray as needed for sore throat. Continue to alternate between Tylenol and Ibuprofen for pain or fever. Use Mucinex for cough suppression/expectoration of mucus. Use netipot and flonase to help with nasal congestion. May consider over-the-counter Benadryl or other antihistamine to decrease secretions and for help with your symptoms. Follow up with the [place] [facility] in 5-7 days for recheck of ongoing symptoms and to establish medical care. Return to emergency department for emergent changing or worsening of symptoms.

## 2016-05-23 NOTE — ED Triage Notes (Signed)
PA advised that patient is not in treatment room.

## 2016-05-23 NOTE — ED Triage Notes (Signed)
Pt has had sneezing with sore throat x 2-3 days.  No fever.  States started out like allergy.  Can't eat.  Feels pressure in his ear.

## 2016-05-23 NOTE — ED Provider Notes (Signed)
[facility]-EMERGENCY DEPT Provider Note   CSN: [id number] Arrival date & time: 05/23/16  1051  By signing my name below, I, Marnette Burgess Long, attest that this documentation has been prepared under the direction and in the presence of [address] [employer]. Electronically Signed: Marnette Burgess Long, Scribe. 05/23/2016. 11:29 AM.  History   Chief Complaint Chief Complaint  Patient presents with  . Sore Throat   The history is provided by the patient and medical records. No language interpreter was used.  Sore Throat  This is a new problem. The current episode started more than 2 days ago. The problem occurs constantly. The problem has been gradually worsening. Pertinent negatives include no chest pain, no abdominal pain and no shortness of breath. The symptoms are aggravated by swallowing. Nothing relieves the symptoms. He has tried acetaminophen (and coffee) for the symptoms. The treatment provided no relief.   HPI Comments:  Keith Blackwell is a 31 y.o. male with a PMHx of Environmental Allergies and Migraines, who presents to the Emergency Department complaining of sore throat x3 days. He describes his pain as 10/10 constant, gradually worsening, pressure-like throat pain radiating to his R ear, worse with swallowing, and unrelieved with tylenol and coffee consumption. Pt has associated symptoms of R ear pain radiating from his throat,  sneezing, rhinorrhea, and a dry cough. He denies ear drainage, drooling, trismus, fevers, chills, CP, SOB, abd pain, N/V/D/C, hematuria, dysuria, myalgias, arthralgias, numbness, tingling, focal weakness, rash, or any other complaints at this time.    Past Medical History:  Diagnosis Date  . Environmental allergies   . Migraine    There are no active problems to display for this patient.  Past Surgical History:  Procedure Laterality Date  . HERNIA REPAIR      Home Medications    Prior to Admission medications   Medication Sig Start Date End Date Taking?  Authorizing Provider  benzonatate (TESSALON) 100 MG capsule Take 1 capsule (100 mg total) by mouth every 8 (eight) hours. 10/31/15   Bethel Born, PA-C  cetirizine (ZYRTEC) 10 MG tablet Take 10 mg by mouth daily.    Historical Provider, MD  clotrimazole (LOTRIMIN) 1 % cream Apply to affected area 2 times daily 01/22/16   Liberty Handy, PA-C  fluticasone [facility]) 50 MCG/ACT nasal spray Place 1 spray into both nostrils daily. 10/31/15   Bethel Born, PA-C  ibuprofen (ADVIL,MOTRIN) 800 MG tablet Take 1 tablet (800 mg total) by mouth every 8 (eight) hours as needed. 03/29/16   Charlestine Night, PA-C  terbinafine (LAMISIL AT) 1 % cream Apply 1 application topically 2 (two) times daily. Patient not taking: Reported on 01/05/2015 12/05/14   Arthor Captain, PA-C    Family History No family history on file.  Social History Social History  Substance Use Topics  . Smoking status: Current Every Day Smoker    Packs/day: 1.00    Types: Cigarettes  . Smokeless tobacco: Never Used  . Alcohol use No    Allergies   Patient has no known allergies.   Review of Systems Review of Systems  Constitutional: Negative for chills and fever.  HENT: Positive for ear pain, rhinorrhea, sneezing and sore throat. Negative for drooling, ear discharge and trouble swallowing.   Respiratory: Positive for cough. Negative for shortness of breath.   Cardiovascular: Negative for chest pain.  Gastrointestinal: Negative for abdominal pain, constipation, diarrhea, nausea and vomiting.  Genitourinary: Negative for dysuria and hematuria.  Musculoskeletal: Negative for arthralgias and myalgias.  Skin: Negative for color change.  Allergic/Immunologic: Positive for environmental allergies. Negative for immunocompromised state.  Neurological: Negative for weakness and numbness.  Psychiatric/Behavioral: Negative for confusion.   All systems reviewed and are negative for acute change except as noted in the  HPI.    Physical Exam Updated Vital Signs BP 138/89 (BP Location: Right Arm)   Pulse 86   Temp 98.2 F (36.8 C) (Oral)   Resp 18   Ht 6' (1.829 m)   Wt 269 lb (122 kg)   SpO2 100%   BMI 36.48 kg/m   Physical Exam  Constitutional: He is oriented to person, place, and time. Vital signs are normal. He appears well-developed and well-nourished.  Non-toxic appearance. No distress.  Afebrile, nontoxic, NAD  HENT:  Head: Normocephalic and atraumatic.  Right Ear: Hearing, tympanic membrane, external ear and ear canal normal.  Left Ear: Hearing, tympanic membrane, external ear and ear canal normal.  Nose: Nose normal.  Mouth/Throat: Uvula is midline and mucous membranes are normal. No trismus in the jaw. No uvula swelling. Posterior oropharyngeal erythema present. No oropharyngeal exudate, posterior oropharyngeal edema or tonsillar abscesses. Tonsils are 0 on the right. Tonsils are 0 on the left. No tonsillar exudate.  Ears are clear bilaterally. Nose clear. Oropharynx mildly injected, without uvular swelling or deviation, no trismus or drooling, no tonsillar swelling, no exudates.  Eyes: Conjunctivae and EOM are normal. Right eye exhibits no discharge. Left eye exhibits no discharge.  Neck: Normal range of motion. Neck supple.  Cardiovascular: Normal rate, regular rhythm, normal heart sounds and intact distal pulses.  Exam reveals no gallop and no friction rub.   No murmur heard. Pulmonary/Chest: Effort normal and breath sounds normal. No respiratory distress. He has no decreased breath sounds. He has no wheezes. He has no rhonchi. He has no rales.  CTAB in all lung fields, no w/r/r, no hypoxia or increased WOB, speaking in full sentences, SpO2 100% on RA  Abdominal: Soft. Normal appearance and bowel sounds are normal. He exhibits no distension. There is no tenderness. There is no rigidity, no rebound, no guarding, no CVA tenderness, no tenderness at McBurney's point and negative Murphy's  sign.  Musculoskeletal: Normal range of motion.  Lymphadenopathy:       Head (right side): Tonsillar adenopathy present.       Head (left side): Tonsillar adenopathy present.    He has cervical adenopathy.  Shotty cervical and bilateral tonsillar LAD which is mildly TTP.   Neurological: He is alert and oriented to person, place, and time. He has normal strength. No sensory deficit.  Skin: Skin is warm, dry and intact. No rash noted.  Psychiatric: He has a normal mood and affect.  Nursing note and vitals reviewed.    ED Treatments / Results  DIAGNOSTIC STUDIES:  Oxygen Saturation is 100% on RA, normal by my interpretation.    COORDINATION OF CARE:  11:27 AM Discussed treatment plan with pt at bedside including rapid antigen test with Group A Strep Culture and a chloraseptic spray and pt agreed to plan.  Labs (all labs ordered are listed, but only abnormal results are displayed) Labs Reviewed  RAPID STREP SCREEN (NOT AT [facility])  CULTURE, GROUP A STREP [facility])    EKG  EKG Interpretation None       Radiology No results found.  Procedures Procedures (including critical care time)  Medications Ordered in ED Medications  phenol (CHLORASEPTIC) mouth spray 1 spray (1 spray Mouth/Throat Given 05/23/16 1241)  Initial Impression / Assessment and Plan / ED Course  I have reviewed the triage vital signs and the nursing notes.  Pertinent labs & imaging results that were available during my care of the patient were reviewed by me and considered in my medical decision making (see chart for details).     31 y.o. male here with sore throat and URI symptoms x3 days. On exam, ears clear, throat injected but no tonsillar swelling, no OP edema, handling secretions well, no PTA; clear lungs. Afebrile and nontoxic. Will obtain RST and give chloraseptic spray for comfort. Will reassess shortly.   1:03 PM RST negative. Likely viral URI vs allergic symptoms. I had previously advised pt  on OTC remedies for symptomatic relief, however unfortunately he left the hospital before formal discharge instructions were given. Nursing staff did instruct him on other OTC remedies. Ideally I'd like for him to establish f/up with [facility] in 1wk for recheck and to establish care, attempted to call him twice on his phone numbers and did not get an answer and his voicemail box was full. Will have staff try to reach out to him to give him that information. Prior to him leaving, I had explained the diagnosis and had given explicit precautions to return to the ER including for any other new or worsening symptoms. The patient understood and accepted the medical plan as it's been dictated and I had answered their questions. Discharge instructions concerning home care and prescriptions have been printed and hopefully pt can be contacted to come get them. The patient was STABLE when he left and is discharged to home in good condition.   I personally performed the services described in this documentation, which was scribed in my presence. The recorded information has been reviewed and is accurate.    Final Clinical Impressions(s) / ED Diagnoses   Final diagnoses:  Sore throat  Viral URI with cough    New Prescriptions New Prescriptions   No medications on file      [address], PA-C 05/23/16 1309    Arby Barrette, MD 05/24/16 [phone]

## 2016-05-25 LAB — CULTURE, GROUP A STREP (THRC)

## 2016-07-09 ENCOUNTER — Encounter (HOSPITAL_COMMUNITY): Payer: Self-pay | Admitting: Emergency Medicine

## 2016-07-09 ENCOUNTER — Ambulatory Visit (INDEPENDENT_AMBULATORY_CARE_PROVIDER_SITE_OTHER): Payer: Worker's Compensation

## 2016-07-09 ENCOUNTER — Ambulatory Visit (HOSPITAL_COMMUNITY)
Admission: EM | Admit: 2016-07-09 | Discharge: 2016-07-09 | Disposition: A | Discharge status: Home / Self Care | Payer: Worker's Compensation | Attending: Internal Medicine | Admitting: Internal Medicine

## 2016-07-09 DIAGNOSIS — S9031XA Contusion of right foot, initial encounter: Secondary | ICD-10-CM | POA: Diagnosis not present

## 2016-07-09 MED ORDER — NAPROXEN 500 MG PO TABS: 500.0000 mg | ORAL_TABLET | Freq: Two times a day (BID) | ORAL | 0 refills | Status: DC

## 2016-07-09 NOTE — ED Provider Notes (Signed)
CSN: [id number]     Arrival date & time 07/09/16  1742 History   First MD Initiated Contact with Patient 07/09/16 1832     Chief Complaint  Patient presents with  . Foot Pain   (Consider location/radiation/quality/duration/timing/severity/associated sxs/prior Treatment) Patient injured right foot at work.  Patient states he hit his right foot on pallet and he has right 5th toe pain.   The history is provided by the patient.  Foot Pain  The problem occurs constantly. The problem has not changed since onset.Nothing aggravates the symptoms. Nothing relieves the symptoms. He has tried nothing for the symptoms.    Past Medical History:  Diagnosis Date  . Environmental allergies   . Migraine    Past Surgical History:  Procedure Laterality Date  . HERNIA REPAIR     No family history on file. Social History  Substance Use Topics  . Smoking status: Current Every Day Smoker    Packs/day: 1.00    Types: Cigarettes  . Smokeless tobacco: Never Used  . Alcohol use No    Review of Systems  Constitutional: Negative.   HENT: Negative.   Eyes: Negative.   Respiratory: Negative.   Cardiovascular: Negative.   Gastrointestinal: Negative.   Endocrine: Negative.   Genitourinary: Negative.   Musculoskeletal: Negative.   Allergic/Immunologic: Negative.   Neurological: Negative.   Hematological: Negative.   Psychiatric/Behavioral: Negative.     Allergies  Patient has no known allergies.  Home Medications   Prior to Admission medications   Medication Sig Start Date End Date Taking? Authorizing Provider  benzonatate (TESSALON) 100 MG capsule Take 1 capsule (100 mg total) by mouth every 8 (eight) hours. 10/31/15   Bethel Born, PA-C  cetirizine (ZYRTEC) 10 MG tablet Take 10 mg by mouth daily.    [provider]  clotrimazole (LOTRIMIN) 1 % cream Apply to affected area 2 times daily 01/22/16   Liberty Handy, PA-C  fluticasone [facility]) 50 MCG/ACT nasal spray Place 1  spray into both nostrils daily. 10/31/15   Bethel Born, PA-C  ibuprofen (ADVIL,MOTRIN) 800 MG tablet Take 1 tablet (800 mg total) by mouth every 8 (eight) hours as needed. 03/29/16   Lawyer, Cristal Deer, PA-C  naproxen (NAPROSYN) 500 MG tablet Take 1 tablet (500 mg total) by mouth 2 (two) times daily with a meal. 07/09/16   Marcellino Fidalgo, Anselm Pancoast, FNP  terbinafine (LAMISIL AT) 1 % cream Apply 1 application topically 2 (two) times daily. Patient not taking: Reported on 01/05/2015 12/05/14   Arthor Captain, PA-C   Meds Ordered and Administered this Visit  Medications - No data to display  BP 139/86 (BP Location: Right Arm) Comment (BP Location): large cuff  Pulse 90   Temp 99 F (37.2 C) (Oral)   Resp 18   SpO2 96%  No data found.   Physical Exam  Constitutional: He appears well-developed and well-nourished.  HENT:  Head: Normocephalic and atraumatic.  Right Ear: External ear normal.  Left Ear: External ear normal.  Mouth/Throat: Oropharynx is clear and moist.  Eyes: Conjunctivae and EOM are normal. Pupils are equal, round, and reactive to light.  Neck: Normal range of motion. Neck supple.  Cardiovascular: Normal rate, regular rhythm and normal heart sounds.   Pulmonary/Chest: Effort normal and breath sounds normal.  Abdominal: Soft. Bowel sounds are normal.  Musculoskeletal: He exhibits tenderness.  TTP right 5th toe with swelling  Nursing note and vitals reviewed.   Urgent Care Course     Procedures (including critical  care time)  Labs Review Labs Reviewed - No data to display  Imaging Review Dg Foot Complete Right  Result Date: 07/09/2016 CLINICAL DATA:  Right foot pain after injury at work last night. EXAM: RIGHT FOOT COMPLETE - 3+ VIEW COMPARISON:  Radiographs of January 22, 2016. FINDINGS: There is no evidence of fracture or dislocation. There is no evidence of arthropathy or other focal bone abnormality. Soft tissues are unremarkable. IMPRESSION: Normal right foot.  Electronically Signed   By: Lupita RaiderJames  Green Jr, M.D.   On: 07/09/2016 19:11     Visual Acuity Review  Right Eye Distance:   Left Eye Distance:   Bilateral Distance:    Right Eye Near:   Left Eye Near:    Bilateral Near:         MDM   1. Contusion of right foot, initial encounter    Naprosyn 500mg  one po bid x 10 days  Light duty  Follow up with Occupational Health tomorrow     Deatra CanterOxford, Audley Hinojos J, FNP 07/09/16 1918

## 2016-07-09 NOTE — ED Triage Notes (Signed)
Last night injured right foot at work.  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  Right foot is swollen and painful.  Patient reports right little toe is very painful

## 2016-07-14 ENCOUNTER — Emergency Department (HOSPITAL_COMMUNITY)
Admission: EM | Admit: 2016-07-14 | Discharge: 2016-07-15 | Disposition: A | Discharge status: Home / Self Care | Payer: Self-pay | Attending: Emergency Medicine | Admitting: Emergency Medicine

## 2016-07-14 ENCOUNTER — Encounter (HOSPITAL_COMMUNITY): Payer: Self-pay | Admitting: Emergency Medicine

## 2016-07-14 ENCOUNTER — Emergency Department (HOSPITAL_COMMUNITY): Payer: Self-pay

## 2016-07-14 DIAGNOSIS — F17210 Nicotine dependence, cigarettes, uncomplicated: Secondary | ICD-10-CM | POA: Insufficient documentation

## 2016-07-14 DIAGNOSIS — Y939 Activity, unspecified: Secondary | ICD-10-CM | POA: Insufficient documentation

## 2016-07-14 DIAGNOSIS — Z79899 Other long term (current) drug therapy: Secondary | ICD-10-CM | POA: Insufficient documentation

## 2016-07-14 DIAGNOSIS — Y990 Civilian activity done for income or pay: Secondary | ICD-10-CM | POA: Insufficient documentation

## 2016-07-14 DIAGNOSIS — M79671 Pain in right foot: Secondary | ICD-10-CM | POA: Insufficient documentation

## 2016-07-14 DIAGNOSIS — Y929 Unspecified place or not applicable: Secondary | ICD-10-CM | POA: Insufficient documentation

## 2016-07-14 DIAGNOSIS — W228XXA Striking against or struck by other objects, initial encounter: Secondary | ICD-10-CM | POA: Insufficient documentation

## 2016-07-14 MED ORDER — ACETAMINOPHEN 500 MG PO TABS
1000.0000 mg | ORAL_TABLET | Freq: Once | ORAL | Status: AC
  Administered 2016-07-14: 1000 mg via ORAL
  Filled 2016-07-14: qty 2

## 2016-07-14 NOTE — ED Notes (Signed)
Pt returned from radiology.

## 2016-07-14 NOTE — ED Triage Notes (Signed)
Pt reports that he was injured on 07/09/16 and had a forklift pallet land on right foot and pt reports having increasing swelling since then. Pt reports when he was seen on 07/09/16 and no fractures were found.

## 2016-07-15 NOTE — Discharge Instructions (Signed)
Please take Ibuprofen (Advil, motrin) and Tylenol (acetaminophen) to relieve your pain.  You may take up to 800 MG (4 pills) of normal strength ibuprofen every 8 hours as needed.  In between doses of ibuprofen you make take tylenol, up to 1,000 mg (two extra strength pills).  Do not take more than 3,500 mg tylenol in a 24 hour period.  Please check all medication labels as many medications such as pain and cold medications may contain tylenol.  Do not drink alcohol while taking these medications.  Do not take other NSAID'S while taking ibuprofen (such as aleve or naproxen).  Please take medications with food to decrease stomach upset.

## 2016-07-15 NOTE — ED Provider Notes (Signed)
[facility]   CSN: [id number] Arrival date & time: 07/14/16  1853     History   Chief Complaint Chief Complaint  Patient presents with  . Foot Pain    HPI Keith Blackwell is a 31 y.o. male who presents for evaluation due to continued right foot/right 5th toe pain.  He reports that on 07/09/16 He was at work when he was hit in the foot with a forklift palate. He was initially seen on 6/6 at urgent care. Patient reports that he has been taking his prescribed naproxen for his pain however it is not helping.  Patient is requesting a work note specifying what "light duty" means.  He denies re-injury.  Does not have a PCP.   HPI  Past Medical History:  Diagnosis Date  . Environmental allergies   . Migraine     There are no active problems to display for this patient.   Past Surgical History:  Procedure Laterality Date  . HERNIA REPAIR         Home Medications    Prior to Admission medications   Medication Sig Start Date End Date Taking? Authorizing Provider  benzonatate (TESSALON) 100 MG capsule Take 1 capsule (100 mg total) by mouth every 8 (eight) hours. 10/31/15   Bethel Born, PA-C  cetirizine (ZYRTEC) 10 MG tablet Take 10 mg by mouth daily.    [provider]  clotrimazole (LOTRIMIN) 1 % cream Apply to affected area 2 times daily 01/22/16   Liberty Handy, PA-C  fluticasone [facility]) 50 MCG/ACT nasal spray Place 1 spray into both nostrils daily. 10/31/15   Bethel Born, PA-C  ibuprofen (ADVIL,MOTRIN) 800 MG tablet Take 1 tablet (800 mg total) by mouth every 8 (eight) hours as needed. 03/29/16   Lawyer, Cristal Deer, PA-C  naproxen (NAPROSYN) 500 MG tablet Take 1 tablet (500 mg total) by mouth 2 (two) times daily with a meal. 07/09/16   Oxford, Anselm Pancoast, FNP  terbinafine (LAMISIL AT) 1 % cream Apply 1 application topically 2 (two) times daily. Patient not taking: Reported on 01/05/2015 12/05/14   Arthor Captain, PA-C     Family History History reviewed. No pertinent family history.  Social History Social History  Substance Use Topics  . Smoking status: Current Every Day Smoker    Packs/day: 1.00    Types: Cigarettes  . Smokeless tobacco: Never Used  . Alcohol use No     Allergies   Patient has no known allergies.   Review of Systems Review of Systems  Musculoskeletal: Positive for arthralgias and myalgias.  Skin: Negative for color change and wound.  Neurological: Negative for numbness.     Physical Exam Updated Vital Signs BP (!) 152/86   Pulse 85   Temp 99.1 F (37.3 C) (Oral)   Resp 16   Ht 6' (1.829 m)   Wt 111.1 kg (245 lb)   SpO2 95%   BMI 33.23 kg/m   Physical Exam  Constitutional: He appears well-developed and well-nourished.  Musculoskeletal:  Right 5th toe is TTP.  No deformities or wounds.  No obvious swelling.  Foot and toe are warm, well perfused with intact sensation and motor function.   Neurological: No sensory deficit.  Skin: Skin is warm and dry. No pallor.  Psychiatric: He has a normal mood and affect. His behavior is normal.  Nursing note and vitals reviewed.    ED Treatments / Results  Labs (all labs ordered are listed, but only abnormal  results are displayed) Labs Reviewed - No data to display  EKG  EKG Interpretation None       Radiology Dg Foot Complete Right  Result Date: 07/14/2016 CLINICAL DATA:  Right foot pain after injury, pain to the fourth and fifth toes EXAM: RIGHT FOOT COMPLETE - 3+ VIEW COMPARISON:  07/09/2016 FINDINGS: There is no evidence of fracture or dislocation. There is no evidence of arthropathy or other focal bone abnormality. Soft tissues are unremarkable. IMPRESSION: Negative. Electronically Signed   By: Jasmine PangKim  Fujinaga M.D.   On: 07/14/2016 22:37    Procedures Procedures (including critical care time)  Medications Ordered in ED Medications  acetaminophen (TYLENOL) tablet 1,000 mg (1,000 mg Oral Given 07/14/16  2343)     Initial Impression / Assessment and Plan / ED Course  I have reviewed the triage vital signs and the nursing notes.  Pertinent labs & imaging results that were available during my care of the patient were reviewed by me and considered in my medical decision making (see chart for details).     Lynn ItoJames J Husk presents with continued toe pain consistent with a contusion.  Repeat radiology with out abnormalities.  Patient was given instructions on OTC pain management, and conservative home therapy. He requested "something stronger" for his pain multiple times, but I do not feel narcotic pain medication is needed or appropriate for him at this time.  He was given a work note.  He was given information for podiatry and wellness clinic if his pain persists.  Patient was given the option to ask questions, all of which were answered to the best of my ability.  At this time there is evidence of an emergency medical condition.  Patient discharged in stable condition.  Final Clinical Impressions(s) / ED Diagnoses   Final diagnoses:  Foot pain, right    New Prescriptions Discharge Medication List as of 07/15/2016 12:07 AM       Cristina GongHammond, Briggs Edelen W, PA-C 07/16/16 1320    Geoffery Lyonselo, Douglas, MD 07/17/16 1640

## 2016-07-31 ENCOUNTER — Emergency Department (HOSPITAL_COMMUNITY)
Admission: EM | Admit: 2016-07-31 | Discharge: 2016-07-31 | Disposition: A | Discharge status: Home / Self Care | Payer: Self-pay | Attending: Emergency Medicine | Admitting: Emergency Medicine

## 2016-07-31 ENCOUNTER — Encounter (HOSPITAL_COMMUNITY): Payer: Self-pay | Admitting: *Deleted

## 2016-07-31 DIAGNOSIS — M79671 Pain in right foot: Secondary | ICD-10-CM

## 2016-07-31 DIAGNOSIS — F17210 Nicotine dependence, cigarettes, uncomplicated: Secondary | ICD-10-CM | POA: Insufficient documentation

## 2016-07-31 DIAGNOSIS — Z79899 Other long term (current) drug therapy: Secondary | ICD-10-CM | POA: Insufficient documentation

## 2016-07-31 MED ORDER — DICLOFENAC SODIUM 1 % TD GEL: 4.0000 g | Freq: Four times a day (QID) | TRANSDERMAL | 0 refills | Status: DC

## 2016-07-31 MED ORDER — METHOCARBAMOL 500 MG PO TABS: 500.0000 mg | ORAL_TABLET | Freq: Three times a day (TID) | ORAL | 0 refills | Status: DC | PRN

## 2016-07-31 NOTE — ED Triage Notes (Signed)
Patient is alert and oriented x4.  He is being seen for continuing right foot pain that was a result of a fork lift accident.  Patient states that he has begun to have shooting pains up the lateral part of his leg from his pinky toes that is new.  Currently he rates his pain 10 of 10 .

## 2016-07-31 NOTE — Discharge Instructions (Signed)

## 2016-07-31 NOTE — ED Provider Notes (Signed)
[facility]-EMERGENCY DEPT Provider Note   CSN: [id number] Arrival date & time: 07/31/16  1311   By signing my name below, I, Clarisse Gouge, attest that this documentation has been prepared under the direction and in the presence of [facility], PA-C. Electronically signed, Clarisse Gouge, ED Scribe. 07/31/16. 2:07 PM.   History   Chief Complaint Chief Complaint  Patient presents with  . Foot Pain   The history is provided by the patient and medical records. No language interpreter was used.    Keith Blackwell is a 31 y.o. male presenting to the Emergency Department concerning persistent R foot pain from an injury the pt sustained ~22 days ago. He states a colleague hit his small toe and foot with the fork of a forklift at work on the date of the original injury: 07/09/2016. He states he immediately went to urgent care where he was dx'ed with a contusion after XR imaging was performed and he was sent home with an Rx for naproxen. Pt states d/t lack of insurance, he obtained ibuprofen instead of filling the Rx. Pt returned [facility] ED for persistent, increased swelling and pain on 07/14/2016 when he received repeat imaging to R/O fractures. No fractures found. He states he was told to take 4 tylenol and 2 ibuprofen daily. Post-operative shoe and ace wrap in place; he states these have provided minimal to no relief. He c/o R small toe pain radiating up the RLE to the anterior shin/ lateral knee area. He describes 10/10, sharp, shooting pains worse with ambulation and applying pressure to the affected areas of pain. Pt referred to orthopedic specialist; he states he is in process to schedule a F/U appointment with a podiatrist. No new injury/ trauma noted. No other complaints at this time.   Past Medical History:  Diagnosis Date  . Environmental allergies   . Migraine     There are no active problems to display for this patient.   Past Surgical History:  Procedure Laterality Date  . HERNIA REPAIR          Home Medications    Prior to Admission medications   Medication Sig Start Date End Date Taking? Authorizing Provider  benzonatate (TESSALON) 100 MG capsule Take 1 capsule (100 mg total) by mouth every 8 (eight) hours. 10/31/15   Bethel Born, PA-C  cetirizine (ZYRTEC) 10 MG tablet Take 10 mg by mouth daily.    [provider]  clotrimazole (LOTRIMIN) 1 % cream Apply to affected area 2 times daily 01/22/16   Liberty Handy, PA-C  diclofenac sodium (VOLTAREN) 1 % GEL Apply 4 g topically 4 (four) times daily. 07/31/16   Trixie Dredge, PA-C  fluticasone (FLONASE) 50 MCG/ACT nasal spray Place 1 spray into both nostrils daily. 10/31/15   Bethel Born, PA-C  ibuprofen (ADVIL,MOTRIN) 800 MG tablet Take 1 tablet (800 mg total) by mouth every 8 (eight) hours as needed. 03/29/16   Lawyer, Cristal Deer, PA-C  methocarbamol (ROBAXIN) 500 MG tablet Take 1-2 tablets (500-1,000 mg total) by mouth every 8 (eight) hours as needed (pain). 07/31/16   Trixie Dredge, PA-C  terbinafine (LAMISIL AT) 1 % cream Apply 1 application topically 2 (two) times daily. Patient not taking: Reported on 01/05/2015 12/05/14   Arthor Captain, PA-C    Family History History reviewed. No pertinent family history.  Social History Social History  Substance Use Topics  . Smoking status: Current Every Day Smoker    Packs/day: 1.00    Types: Cigarettes  .  Smokeless tobacco: Never Used  . Alcohol use No     Allergies   Patient has no known allergies.   Review of Systems Review of Systems  Cardiovascular: Negative for leg swelling.  Musculoskeletal: Positive for arthralgias and myalgias.  Skin: Negative for color change and wound.  Neurological: Negative for weakness and numbness.  Hematological: Does not bruise/bleed easily.  Psychiatric/Behavioral: Negative for self-injury.  All other systems reviewed and are negative.    Physical Exam Updated Vital Signs BP (!) 147/95 (BP Location: Left  Arm)   Pulse (!) 107   Temp 97.9 F (36.6 C) (Oral)   Resp 16   Ht 6' (1.829 m)   Wt 245 lb (111.1 kg)   SpO2 100%   BMI 33.23 kg/m   Physical Exam  Constitutional: He appears well-developed and well-nourished.  HENT:  Head: Normocephalic and atraumatic.  Neck: Neck supple.  Pulmonary/Chest: Effort normal.  Musculoskeletal: He exhibits tenderness.  R foot with focal tenderness over the lateral distal 5th metatarsal. No other focal tenderness. No skin discoloration. Moves all toes.  Neurological: He is alert.  Nursing note and vitals reviewed.    ED Treatments / Results  DIAGNOSTIC STUDIES: Oxygen Saturation is 100% on RA, NL by my interpretation.    COORDINATION OF CARE: 1:48 PM-Discussed next steps with pt. Pt verbalized understanding and is agreeable with the plan. Will order walking boot.   Labs (all labs ordered are listed, but only abnormal results are displayed) Labs Reviewed - No data to display  EKG  EKG Interpretation None       Radiology No results found.  Procedures Procedures (including critical care time)  Medications Ordered in ED Medications - No data to display   Initial Impression / Assessment and Plan / ED Course  I have reviewed the triage vital signs and the nursing notes.  Pertinent labs & imaging results that were available during my care of the patient were reviewed by me and considered in my medical decision making (see chart for details).    Afebrile, nontoxic patient with injury to his right foot, hit by a forklift earlier in the month. Neurovascularly intact.  No new injury.   Xrays x 2 previously have been negative.  Not repeated today.  Pt not getting relief with postop shoe and ace wrap, OTC medications.  Placed in cam walker with improvement of discomfort.   D/C home with symptomatic medications, follow up as previously indicated.   Discussed result, findings, treatment, and follow up  with patient.  Pt given return precautions.   Pt verbalizes understanding and agrees with plan.      Final Clinical Impressions(s) / ED Diagnoses   Final diagnoses:  Right foot pain    New Prescriptions Discharge Medication List as of 07/31/2016  1:51 PM    START taking these medications   Details  diclofenac sodium (VOLTAREN) 1 % GEL Apply 4 g topically 4 (four) times daily., Starting Thu 07/31/2016, Print    methocarbamol (ROBAXIN) 500 MG tablet Take 1-2 tablets (500-1,000 mg total) by mouth every 8 (eight) hours as needed (pain)., Starting Thu 07/31/2016, Print        I personally performed the services described in this documentation, which was scribed in my presence. The recorded information has been reviewed and is accurate.    Trixie DredgeWest, Chandria Rookstool, Cordelia Poche-C 07/31/16 1810    Raeford RazorKohut, Stephen, MD 08/08/16 Avanell [id number]

## 2017-08-07 ENCOUNTER — Emergency Department (HOSPITAL_COMMUNITY)
Admission: EM | Admit: 2017-08-07 | Discharge: 2017-08-07 | Disposition: A | Discharge status: Home / Self Care | Payer: Self-pay | Attending: Emergency Medicine | Admitting: Emergency Medicine

## 2017-08-07 ENCOUNTER — Other Ambulatory Visit: Payer: Self-pay

## 2017-08-07 ENCOUNTER — Encounter (HOSPITAL_COMMUNITY): Payer: Self-pay | Admitting: Emergency Medicine

## 2017-08-07 DIAGNOSIS — Z202 Contact with and (suspected) exposure to infections with a predominantly sexual mode of transmission: Secondary | ICD-10-CM | POA: Insufficient documentation

## 2017-08-07 DIAGNOSIS — F17210 Nicotine dependence, cigarettes, uncomplicated: Secondary | ICD-10-CM | POA: Insufficient documentation

## 2017-08-07 LAB — URINALYSIS, ROUTINE W REFLEX MICROSCOPIC
Bilirubin Urine: NEGATIVE
Glucose, UA: NEGATIVE mg/dL
Hgb urine dipstick: NEGATIVE
Ketones, ur: NEGATIVE mg/dL
Leukocytes, UA: NEGATIVE
NITRITE: NEGATIVE
Protein, ur: NEGATIVE mg/dL
SPECIFIC GRAVITY, URINE: 1.021 (ref 1.005–1.030)
pH: 6 (ref 5.0–8.0)

## 2017-08-07 MED ORDER — AZITHROMYCIN 250 MG PO TABS
1000.0000 mg | ORAL_TABLET | Freq: Once | ORAL | Status: AC
  Administered 2017-08-07: 1000 mg via ORAL
  Filled 2017-08-07: qty 4

## 2017-08-07 MED ORDER — CEFTRIAXONE SODIUM 250 MG IJ SOLR
250.0000 mg | Freq: Once | INTRAMUSCULAR | Status: AC
  Administered 2017-08-07: 250 mg via INTRAMUSCULAR
  Filled 2017-08-07: qty 250

## 2017-08-07 MED ORDER — METRONIDAZOLE 500 MG PO TABS
2000.0000 mg | ORAL_TABLET | Freq: Once | ORAL | Status: AC
  Administered 2017-08-07: 2000 mg via ORAL
  Filled 2017-08-07: qty 4

## 2017-08-07 MED ORDER — LIDOCAINE HCL 1 % IJ SOLN
INTRAMUSCULAR | Status: AC
  Administered 2017-08-07: 0.9 mL
  Filled 2017-08-07: qty 20

## 2017-08-07 NOTE — ED Triage Notes (Addendum)
Pt verbalizes "my lady said she got tricomoniasis." Denies symptoms.

## 2017-08-07 NOTE — Discharge Instructions (Addendum)
You have been tested for STDs. Some of these results are still pending. Any abnormalities will be called to you. You have been empirically treated for gonorrhea, chlamydia, and trichomonas. This does not mean you necessarily have these diseases, treatment is precautionary. Be sure to follow safe sex practices, including monogamy and/or condom use. All sexual partners must also be notified and treated.  Any future STD testing or treatment should be performed at the [facility] or primary care office. For the treatment to be fully effective, avoid all sexual contact for at least 2 weeks after medication administration.  Your blood pressure was higher than normal today.  Please follow-up with a primary care provider on this matter as soon as possible.

## 2017-08-07 NOTE — ED Provider Notes (Signed)
[facility]-EMERGENCY DEPT Provider Note   CSN: [id number] Arrival date & time: 08/07/17  1027     History   Chief Complaint Chief Complaint  Patient presents with  . Exposure to STD    HPI Keith ItoJames J Blackwell is a 32 y.o. male.  HPI   Keith Blackwell is a 32 y.o. male, presenting to the ED with concern for exposure to STD.  States his significant other tested positive for trichomonas 5 days ago.  He has been sexually active with 2 male partners.  Denies dysuria, hematuria, penile discharge, testicular pain or swelling, abdominal pain, nausea/vomiting, pain with bowel movements, fever/chills, or any other complaints.     Past Medical History:  Diagnosis Date  . Environmental allergies   . Migraine     There are no active problems to display for this patient.   Past Surgical History:  Procedure Laterality Date  . HERNIA REPAIR          Home Medications    Prior to Admission medications   Medication Sig Start Date End Date Taking? Authorizing Provider  benzonatate (TESSALON) 100 MG capsule Take 1 capsule (100 mg total) by mouth every 8 (eight) hours. 10/31/15   Bethel BornGekas, Kelly Marie, PA-C  cetirizine (ZYRTEC) 10 MG tablet Take 10 mg by mouth daily.    [provider]  clotrimazole (LOTRIMIN) 1 % cream Apply to affected area 2 times daily 01/22/16   Liberty HandyGibbons, Claudia J, PA-C  diclofenac sodium (VOLTAREN) 1 % GEL Apply 4 g topically 4 (four) times daily. 07/31/16   Trixie DredgeWest, Emily, PA-C  fluticasone (FLONASE) 50 MCG/ACT nasal spray Place 1 spray into both nostrils daily. 10/31/15   Bethel BornGekas, Kelly Marie, PA-C  ibuprofen (ADVIL,MOTRIN) 800 MG tablet Take 1 tablet (800 mg total) by mouth every 8 (eight) hours as needed. 03/29/16   Lawyer, Cristal Deerhristopher, PA-C  methocarbamol (ROBAXIN) 500 MG tablet Take 1-2 tablets (500-1,000 mg total) by mouth every 8 (eight) hours as needed (pain). 07/31/16   Trixie DredgeWest, Emily, PA-C  terbinafine (LAMISIL AT) 1 % cream Apply 1  application topically 2 (two) times daily. Patient not taking: Reported on 01/05/2015 12/05/14   Arthor CaptainHarris, Abigail, PA-C    Family History No family history on file.  Social History Social History   Tobacco Use  . Smoking status: Current Every Day Smoker    Packs/day: 1.00    Types: Cigarettes  . Smokeless tobacco: Never Used  Substance Use Topics  . Alcohol use: No  . Drug use: No     Allergies   Patient has no known allergies.   Review of Systems Review of Systems  Constitutional: Negative for fever.  Gastrointestinal: Negative for abdominal pain, nausea and vomiting.  Genitourinary: Negative for discharge, dysuria, genital sores, hematuria, penile pain, penile swelling, scrotal swelling and testicular pain.       Exposure to trichomonas     Physical Exam Updated Vital Signs BP (!) 162/107 (BP Location: Left Arm)   Pulse 86   Temp 98.3 F (36.8 C) (Oral)   Resp 16   SpO2 100%   Physical Exam  Constitutional: He appears well-developed and well-nourished. No distress.  HENT:  Head: Normocephalic and atraumatic.  Eyes: Conjunctivae are normal.  Neck: Neck supple.  Cardiovascular: Normal rate and regular rhythm.  Pulmonary/Chest: Effort normal.  Abdominal: Soft. There is no tenderness. There is no guarding.  Genitourinary:  Genitourinary Comments: Penis, scrotum, and testicles without swelling, lesions, or tenderness. No penile discharge.  No  inguinal lymphadenopathy. Overall normal male genitalia.   Neurological: He is alert.  Skin: Skin is warm and dry. He is not diaphoretic. No pallor.  Psychiatric: He has a normal mood and affect. His behavior is normal.  Nursing note and vitals reviewed.    ED Treatments / Results  Labs (all labs ordered are listed, but only abnormal results are displayed) Labs Reviewed  URINALYSIS, ROUTINE W REFLEX MICROSCOPIC - Abnormal; Notable for the following components:      Result Value   Color, Urine AMBER (*)    All other  components within normal limits  RPR  HIV ANTIBODY (ROUTINE TESTING)  GC/CHLAMYDIA PROBE AMP ([place]) NOT AT [facility]    EKG None  Radiology No results found.  Procedures Procedures (including critical care time)  Medications Ordered in ED Medications  cefTRIAXone (ROCEPHIN) injection 250 mg (250 mg Intramuscular Given 08/07/17 1317)  azithromycin (ZITHROMAX) tablet 1,000 mg (1,000 mg Oral Given 08/07/17 1315)  metroNIDAZOLE (FLAGYL) tablet 2,000 mg (2,000 mg Oral Given 08/07/17 1315)  lidocaine (XYLOCAINE) 1 % (with pres) injection (0.9 mLs  Given 08/07/17 1317)     Initial Impression / Assessment and Plan / ED Course  I have reviewed the triage vital signs and the nursing notes.  Pertinent labs & imaging results that were available during my care of the patient were reviewed by me and considered in my medical decision making (see chart for details).     Patient presents with exposure to trichomonas.  Patient is symptom-free.  Empiric treatment for gonorrhea, chlamydia, and trichomonas provided.  Patient understands to refrain from sexual contact to avoid repeat infection.  Safe sex practices discussed.  Patient's hypertension reading also discussed.  Advised PCP follow-up.  Patient symptom-free.  Final Clinical Impressions(s) / ED Diagnoses   Final diagnoses:  STD exposure    ED Discharge Orders    None       Concepcion Living 08/07/17 1346    Linwood Dibbles, MD 08/08/17 1452

## 2017-08-07 NOTE — ED Notes (Signed)
Bed: WTR7 Expected date:  Expected time:  Means of arrival:  Comments: 

## 2017-08-08 LAB — RPR: RPR: NONREACTIVE

## 2017-08-08 LAB — HIV ANTIBODY (ROUTINE TESTING W REFLEX): HIV SCREEN 4TH GENERATION: NONREACTIVE

## 2017-08-10 LAB — GC/CHLAMYDIA PROBE AMP (~~LOC~~) NOT AT ARMC
CHLAMYDIA, DNA PROBE: NEGATIVE
NEISSERIA GONORRHEA: NEGATIVE

## 2017-08-31 ENCOUNTER — Other Ambulatory Visit: Payer: Self-pay

## 2017-08-31 ENCOUNTER — Emergency Department (HOSPITAL_COMMUNITY): Payer: Worker's Compensation

## 2017-08-31 ENCOUNTER — Emergency Department (HOSPITAL_COMMUNITY)
Admission: EM | Admit: 2017-08-31 | Discharge: 2017-08-31 | Disposition: A | Discharge status: Home / Self Care | Payer: Worker's Compensation | Attending: Emergency Medicine | Admitting: Emergency Medicine

## 2017-08-31 ENCOUNTER — Encounter (HOSPITAL_COMMUNITY): Payer: Self-pay

## 2017-08-31 DIAGNOSIS — T148XXA Other injury of unspecified body region, initial encounter: Secondary | ICD-10-CM

## 2017-08-31 DIAGNOSIS — S91302A Unspecified open wound, left foot, initial encounter: Secondary | ICD-10-CM | POA: Diagnosis not present

## 2017-08-31 DIAGNOSIS — Y990 Civilian activity done for income or pay: Secondary | ICD-10-CM | POA: Insufficient documentation

## 2017-08-31 DIAGNOSIS — Y9289 Other specified places as the place of occurrence of the external cause: Secondary | ICD-10-CM | POA: Insufficient documentation

## 2017-08-31 DIAGNOSIS — Y9389 Activity, other specified: Secondary | ICD-10-CM | POA: Insufficient documentation

## 2017-08-31 DIAGNOSIS — Z79899 Other long term (current) drug therapy: Secondary | ICD-10-CM | POA: Diagnosis not present

## 2017-08-31 DIAGNOSIS — Z23 Encounter for immunization: Secondary | ICD-10-CM | POA: Insufficient documentation

## 2017-08-31 DIAGNOSIS — F17210 Nicotine dependence, cigarettes, uncomplicated: Secondary | ICD-10-CM | POA: Diagnosis not present

## 2017-08-31 DIAGNOSIS — S99922A Unspecified injury of left foot, initial encounter: Secondary | ICD-10-CM | POA: Diagnosis present

## 2017-08-31 DIAGNOSIS — W208XXA Other cause of strike by thrown, projected or falling object, initial encounter: Secondary | ICD-10-CM | POA: Insufficient documentation

## 2017-08-31 LAB — RAPID URINE DRUG SCREEN, HOSP PERFORMED
AMPHETAMINES: NOT DETECTED
BARBITURATES: NOT DETECTED
BENZODIAZEPINES: NOT DETECTED
COCAINE: NOT DETECTED
Opiates: NOT DETECTED
Tetrahydrocannabinol: NOT DETECTED

## 2017-08-31 MED ORDER — LIDOCAINE-EPINEPHRINE (PF) 2 %-1:200000 IJ SOLN
20.0000 mL | Freq: Once | INTRAMUSCULAR | Status: AC
  Administered 2017-08-31: 20 mL
  Filled 2017-08-31: qty 20

## 2017-08-31 MED ORDER — IBUPROFEN 800 MG PO TABS
800.0000 mg | ORAL_TABLET | Freq: Once | ORAL | Status: AC
  Administered 2017-08-31: 800 mg via ORAL
  Filled 2017-08-31: qty 1

## 2017-08-31 MED ORDER — MELOXICAM 15 MG PO TABS: 15.0000 mg | ORAL_TABLET | Freq: Every day | ORAL | 0 refills | Status: DC

## 2017-08-31 MED ORDER — TETANUS-DIPHTH-ACELL PERTUSSIS 5-2.5-18.5 LF-MCG/0.5 IM SUSP
0.5000 mL | Freq: Once | INTRAMUSCULAR | Status: AC
  Administered 2017-08-31: 0.5 mL via INTRAMUSCULAR
  Filled 2017-08-31: qty 0.5

## 2017-08-31 NOTE — ED Notes (Signed)
No answer for triage x2 

## 2017-08-31 NOTE — ED Provider Notes (Signed)
[facility]   CSN: [id number] Arrival date & time: 08/31/17  1823     History   Chief Complaint Chief Complaint  Patient presents with  . Laceration    HPI Keith Blackwell is a 32 y.o. male who presents the emergency department chief complaint of left heel injury.  Patient was working today when a large plate of glass slid off of a loading machine and slid down the back of his left heel.  He had immediate severe pain and bleeding.  He is able to flex and extend at the ankle but states it hurts when he tries to ambulate.  He is unsure of his last tetanus vaccination.  His tetanus was ordered at triage.  HPI  Past Medical History:  Diagnosis Date  . Environmental allergies   . Migraine     There are no active problems to display for this patient.   Past Surgical History:  Procedure Laterality Date  . HERNIA REPAIR          Home Medications    Prior to Admission medications   Medication Sig Start Date End Date Taking? Authorizing Provider  benzonatate (TESSALON) 100 MG capsule Take 1 capsule (100 mg total) by mouth every 8 (eight) hours. 10/31/15   Bethel BornGekas, Kelly Marie, PA-C  cetirizine (ZYRTEC) 10 MG tablet Take 10 mg by mouth daily.    [provider]  clotrimazole (LOTRIMIN) 1 % cream Apply to affected area 2 times daily 01/22/16   Liberty HandyGibbons, Claudia J, PA-C  diclofenac sodium (VOLTAREN) 1 % GEL Apply 4 g topically 4 (four) times daily. 07/31/16   Trixie DredgeWest, Emily, PA-C  fluticasone (FLONASE) 50 MCG/ACT nasal spray Place 1 spray into both nostrils daily. 10/31/15   Bethel BornGekas, Kelly Marie, PA-C  ibuprofen (ADVIL,MOTRIN) 800 MG tablet Take 1 tablet (800 mg total) by mouth every 8 (eight) hours as needed. 03/29/16   Lawyer, Cristal Deerhristopher, PA-C  methocarbamol (ROBAXIN) 500 MG tablet Take 1-2 tablets (500-1,000 mg total) by mouth every 8 (eight) hours as needed (pain). 07/31/16   Trixie DredgeWest, Emily, PA-C  terbinafine (LAMISIL AT) 1 %  cream Apply 1 application topically 2 (two) times daily. Patient not taking: Reported on 01/05/2015 12/05/14   Arthor CaptainHarris, Ignace Mandigo, PA-C    Family History History reviewed. No pertinent family history.  Social History Social History   Tobacco Use  . Smoking status: Current Every Day Smoker    Packs/day: 1.00    Types: Cigarettes  . Smokeless tobacco: Never Used  Substance Use Topics  . Alcohol use: No  . Drug use: No     Allergies   Patient has no known allergies.   Review of Systems Review of Systems  Musculoskeletal: Positive for gait problem.  Skin: Positive for wound.  Neurological: Negative for weakness and numbness.     Physical Exam Updated Vital Signs BP 139/85   Pulse 92   Temp 98.5 F (36.9 C) (Oral)   Resp 19   Ht 6\' 2"  (1.88 m)   Wt 111.1 kg (245 lb)   SpO2 100%   BMI 31.46 kg/m   Physical Exam  Constitutional: He is oriented to person, place, and time. He appears well-developed and well-nourished. No distress.  HENT:  Head: Normocephalic and atraumatic.  Eyes: Conjunctivae are normal. No scleral icterus.  Neck: Normal range of motion. Neck supple.  Cardiovascular: Normal rate, regular rhythm and normal heart sounds.  Pulmonary/Chest: Effort normal and breath sounds normal. No respiratory distress.  Abdominal: Soft. There is no tenderness.  Musculoskeletal: He exhibits no edema.  Linear avulsion wound over the left Achilles tendon and heel.  Superficial.  Full range of motion of the ankle, negative Thompson's test  Neurological: He is alert and oriented to person, place, and time.  Skin: Skin is warm and dry. He is not diaphoretic.  Psychiatric: His behavior is normal.  Nursing note and vitals reviewed.    ED Treatments / Results  Labs (all labs ordered are listed, but only abnormal results are displayed) Labs Reviewed  RAPID URINE DRUG SCREEN, HOSP PERFORMED    EKG None  Radiology Dg Ankle 2 Views Left  Result Date:  08/31/2017 CLINICAL DATA:  Laceration.  Pain. EXAM: LEFT ANKLE - 2 VIEW COMPARISON:  None. FINDINGS: There is no evidence of fracture, dislocation, or joint effusion. There is no evidence of arthropathy or other focal bone abnormality. Mild soft tissue swelling. No visible radiopaque foreign body. IMPRESSION: Mild soft tissue swelling. No fracture or visible radiopaque foreign body. Electronically Signed   By: Elsie Stain M.D.   On: 08/31/2017 19:56    Procedures Wound repair Date/Time: 09/01/2017 1:38 AM Performed by: Arthor Captain, PA-C Authorized by: Arthor Captain, PA-C  Consent: Verbal consent obtained. Written consent obtained. Risks and benefits: risks, benefits and alternatives were discussed Consent given by: patient Patient identity confirmed: verbally with patient and provided demographic data Time out: Immediately prior to procedure a "time out" was called to verify the correct patient, procedure, equipment, support staff and site/side marked as required. Local anesthesia used: yes  Anesthesia: Local anesthesia used: yes Local Anesthetic: lidocaine 2% with epinephrine  Sedation: Patient sedated: no  Patient tolerance: Patient tolerated the procedure well with no immediate complications Comments: Patient with tissue avulsion.  I anesthetized the area and cleaned it thoroughly and applied sterile bandaging.    (including critical care time)  Medications Ordered in ED Medications  lidocaine-EPINEPHrine (XYLOCAINE W/EPI) 2 %-1:200000 (PF) injection 20 mL (has no administration in time range)  Tdap (BOOSTRIX) injection 0.5 mL (0.5 mLs Intramuscular Given 08/31/17 2010)  ibuprofen (ADVIL,MOTRIN) tablet 800 mg (800 mg Oral Given 08/31/17 2010)     Initial Impression / Assessment and Plan / ED Course  I have reviewed the triage vital signs and the nursing notes.  Pertinent labs & imaging results that were available during my care of the patient were reviewed by me and  considered in my medical decision making (see chart for details).     With avulsion injury of the tissue urine rapid drug screen negative personally reviewed the two-view ankle series which shows no acute abnormalities and I agree with radiologic interpretation.  Patient has normal strength, negative Thompson test and no concern for Achilles tendon injury.  Patient was able to tolerate wound cleaning after anesthesia of the area locally.  I discussed wound care and return precautions.  He appears appropriate for discharge at this time  Final Clinical Impressions(s) / ED Diagnoses   Final diagnoses:  None    ED Discharge Orders    None       Arthor Captain, PA-C 09/01/17 0140    Raeford Razor, MD 09/04/17 1623

## 2017-08-31 NOTE — ED Notes (Signed)
No answer for triage x1 

## 2017-08-31 NOTE — ED Triage Notes (Signed)
Patient here from work for a laceration to left heel.  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  A&Ox4.  Drug test requested by workplace.

## 2017-08-31 NOTE — ED Notes (Signed)
See EDP assessment 

## 2017-08-31 NOTE — Discharge Instructions (Addendum)
Get help right away if: Your pain suddenly increases and is severe. You develop severe swelling around the wound. You develop numbness around the wound. You have nausea and vomiting that does not go away after 24 hours. You feel light-headed, weak, or faint. You develop chest pain. You have trouble breathing. Your wound is on your hand or foot and you cannot properly move a finger or toe. The wound is on your hand or foot and you notice that your fingers or toes look pale or bluish. You have a red streak going away from your wound.

## 2017-08-31 NOTE — ED Provider Notes (Signed)
Patient placed in Quick Look pathway, seen and evaluated   Chief Complaint: Laceration  HPI:   Patient presents for evaluation of laceration to the back of the ankle, injury occurred while he was at work when he got hit with a piece of wooden and glass furniture.  Lots of bleeding reported by the patient, now controlled.  Still able to move ankle up and down.  Unsure of last tetanus  ROS:  + Laceration, ankle pain. -Numbness, weakness  Physical Exam:   Gen: No distress  Neuro: Awake and Alert  Skin: Warm    Focused Exam: Laceration to the posterior calf and dressing, patient is still able to plantar and dorsiflex the ankle, sensation intact, 2+ DP pulse   Initiation of care has begun. The patient has been counseled on the process, plan, and necessity for staying for the completion/evaluation, and the remainder of the medical screening examination    Legrand RamsFord, Himmat Enberg N, PA-C 08/31/17 1924    Azalia Bilisampos, Kevin, MD 08/31/17 [phone]

## 2017-08-31 NOTE — ED Notes (Signed)
EDPA spoke with employer who states UDS performed by hospital since after hours is still acceptable.

## 2017-09-10 ENCOUNTER — Other Ambulatory Visit: Payer: Self-pay | Admitting: Occupational Medicine

## 2017-09-10 ENCOUNTER — Ambulatory Visit: Payer: Self-pay

## 2017-09-10 DIAGNOSIS — M25562 Pain in left knee: Secondary | ICD-10-CM

## 2017-10-15 DIAGNOSIS — M25562 Pain in left knee: Secondary | ICD-10-CM

## 2017-10-15 DIAGNOSIS — S86819A Strain of other muscle(s) and tendon(s) at lower leg level, unspecified leg, initial encounter: Secondary | ICD-10-CM

## 2017-10-15 HISTORY — DX: Pain in left knee: M25.562

## 2017-10-15 HISTORY — DX: Strain of other muscle(s) and tendon(s) at lower leg level, unspecified leg, initial encounter: S86.819A

## 2017-12-22 DIAGNOSIS — M25561 Pain in right knee: Secondary | ICD-10-CM

## 2017-12-22 HISTORY — DX: Pain in right knee: M25.561

## 2019-02-08 ENCOUNTER — Ambulatory Visit: Payer: Self-pay | Attending: Internal Medicine

## 2019-02-08 DIAGNOSIS — Z20822 Contact with and (suspected) exposure to covid-19: Secondary | ICD-10-CM

## 2019-02-10 LAB — NOVEL CORONAVIRUS, NAA: SARS-CoV-2, NAA: NOT DETECTED

## 2019-03-07 ENCOUNTER — Ambulatory Visit: Payer: Self-pay | Attending: Internal Medicine

## 2019-03-07 DIAGNOSIS — Z20822 Contact with and (suspected) exposure to covid-19: Secondary | ICD-10-CM

## 2019-03-08 LAB — NOVEL CORONAVIRUS, NAA: SARS-CoV-2, NAA: NOT DETECTED

## 2020-10-04 ENCOUNTER — Encounter (HOSPITAL_COMMUNITY): Payer: Self-pay | Admitting: Emergency Medicine

## 2020-10-04 ENCOUNTER — Emergency Department (HOSPITAL_COMMUNITY)
Admission: EM | Admit: 2020-10-04 | Discharge: 2020-10-05 | Disposition: A | Discharge status: Home / Self Care | Payer: Self-pay | Attending: Emergency Medicine | Admitting: Emergency Medicine

## 2020-10-04 ENCOUNTER — Other Ambulatory Visit: Payer: Self-pay

## 2020-10-04 DIAGNOSIS — W57XXXA Bitten or stung by nonvenomous insect and other nonvenomous arthropods, initial encounter: Secondary | ICD-10-CM | POA: Insufficient documentation

## 2020-10-04 DIAGNOSIS — Y990 Civilian activity done for income or pay: Secondary | ICD-10-CM | POA: Insufficient documentation

## 2020-10-04 DIAGNOSIS — R21 Rash and other nonspecific skin eruption: Secondary | ICD-10-CM | POA: Insufficient documentation

## 2020-10-04 DIAGNOSIS — F17210 Nicotine dependence, cigarettes, uncomplicated: Secondary | ICD-10-CM | POA: Insufficient documentation

## 2020-10-04 MED ORDER — HYDROCORTISONE 2.5 % EX LOTN
TOPICAL_LOTION | Freq: Two times a day (BID) | CUTANEOUS | 0 refills | Status: DC
Start: 2020-10-04 — End: 2022-08-15

## 2020-10-04 MED ORDER — DIPHENHYDRAMINE HCL 25 MG PO CAPS
50.0000 mg | ORAL_CAPSULE | Freq: Once | ORAL | Status: AC
  Administered 2020-10-04: 50 mg via ORAL
  Filled 2020-10-04: qty 2

## 2020-10-04 NOTE — ED Provider Notes (Signed)
[facility]-EMERGENCY DEPT Provider Note   CSN: [id number] Arrival date & time: 10/04/20  2102     History Chief Complaint  Patient presents with  . Insect Bite    Keith Blackwell is a 35 y.o. male presents to the emergency department complaining of possible spider bite.  Patient reports this occurred around 59 AM.  He states he was at work today when he felt some thing potentially bite him.  When he smacked his stomach he noticed it was a spider and reports that he has since developed a red site with itching.  Patient is not sure what kind of spider however is adamant that it was not a black widow.  No treatments prior to arrival.  Patient denies pain at the site, burning or oozing of fluid.  He denies blistering.  Patient denies associated myalgias or muscle cramps.  No fevers or chills.  Denies a history of eczema or psoriasis.  The history is provided by the patient and medical records. No language interpreter was used.      Past Medical History:  Diagnosis Date  . Environmental allergies   . Migraine     There are no problems to display for this patient.   Past Surgical History:  Procedure Laterality Date  . HERNIA REPAIR         No family history on file.  Social History   Tobacco Use  . Smoking status: Every Day    Packs/day: 1.00    Types: Cigarettes  . Smokeless tobacco: Never  Substance Use Topics  . Alcohol use: No  . Drug use: No    Home Medications Prior to Admission medications   Medication Sig Start Date End Date Taking? Authorizing Provider  hydrocortisone 2.5 % lotion Apply topically 2 (two) times daily. 10/04/20  Yes Airika Alkhatib, Dahlia Client, PA-C  benzonatate (TESSALON) 100 MG capsule Take 1 capsule (100 mg total) by mouth every 8 (eight) hours. 10/31/15   Bethel Born, PA-C  cetirizine (ZYRTEC) 10 MG tablet Take 10 mg by mouth daily.    [provider]  clotrimazole (LOTRIMIN) 1 % cream Apply to affected area 2  times daily 01/22/16   Liberty Handy, PA-C  diclofenac sodium (VOLTAREN) 1 % GEL Apply 4 g topically 4 (four) times daily. 07/31/16   Trixie Dredge, PA-C  fluticasone (FLONASE) 50 MCG/ACT nasal spray Place 1 spray into both nostrils daily. 10/31/15   Bethel Born, PA-C  ibuprofen (ADVIL,MOTRIN) 800 MG tablet Take 1 tablet (800 mg total) by mouth every 8 (eight) hours as needed. 03/29/16   Lawyer, Cristal Deer, PA-C  meloxicam (MOBIC) 15 MG tablet Take 1 tablet (15 mg total) by mouth daily. Take 1 daily with food. 08/31/17   Arthor Captain, PA-C  methocarbamol (ROBAXIN) 500 MG tablet Take 1-2 tablets (500-1,000 mg total) by mouth every 8 (eight) hours as needed (pain). 07/31/16   Trixie Dredge, PA-C  terbinafine (LAMISIL AT) 1 % cream Apply 1 application topically 2 (two) times daily. Patient not taking: Reported on 01/05/2015 12/05/14   Arthor Captain, PA-C    Allergies    Patient has no known allergies.  Review of Systems   Review of Systems  Constitutional:  Negative for chills and fever.  HENT:  Negative for congestion.   Eyes:  Negative for visual disturbance.  Respiratory:  Negative for shortness of breath.   Cardiovascular:  Negative for chest pain.  Gastrointestinal:  Negative for abdominal pain, nausea and vomiting.  Musculoskeletal:  Negative for arthralgias and myalgias.  Skin:  Positive for color change and rash.  Allergic/Immunologic: Negative for immunocompromised state.  Neurological:  Negative for tremors, numbness and headaches.  Hematological:  Negative for adenopathy. Does not bruise/bleed easily.   Physical Exam Updated Vital Signs BP (!) 137/102 (BP Location: Left Arm)   Pulse 88   Temp 98.1 F (36.7 C) (Oral)   Resp 18   SpO2 94%   Physical Exam Vitals and nursing note reviewed.  Constitutional:      General: He is not in acute distress.    Appearance: He is well-developed. He is not ill-appearing.  HENT:     Head: Normocephalic.  Eyes:     General:  No scleral icterus.    Conjunctiva/sclera: Conjunctivae normal.  Cardiovascular:     Rate and Rhythm: Normal rate.  Pulmonary:     Effort: Pulmonary effort is normal.  Abdominal:     General: There is no distension.  Musculoskeletal:        General: Normal range of motion.     Cervical back: Normal range of motion.  Skin:    General: Skin is warm and dry.     Findings: Rash present.     Comments: Area of erythema and scaling to the abd.  No puncture wound noted.  No induration or increased warmth.  No tenderness.  No ulceration. See photo  Neurological:     Mental Status: He is alert.  Psychiatric:        Mood and Affect: Mood normal.      ED Results / Procedures / Treatments     Procedures Procedures   Medications Ordered in ED Medications  diphenhydrAMINE (BENADRYL) capsule 50 mg (has no administration in time range)    ED Course  I have reviewed the triage vital signs and the nursing notes.  Pertinent labs & imaging results that were available during my care of the patient were reviewed by me and considered in my medical decision making (see chart for details).    MDM Rules/Calculators/A&P                           Patient presents with rash to his abdomen and is concerned that it may have been a spider bite because he did kill a spider on his abdomen earlier today.  Area is not warm to touch, tender, ulcerated or blistered.  No puncture wounds noted.  The rash is dry and scaly.  Suspect more of a chronic contact dermatitis.  Will give Benadryl here in the emergency department and hydrocortisone at home.  Did give return precautions for likely brown recluse bite or development of infectious symptoms.  Patient states understanding and is in agreement with the plan.   Final Clinical Impression(s) / ED Diagnoses Final diagnoses:  Rash    Rx / DC Orders ED Discharge Orders          Ordered    hydrocortisone 2.5 % lotion  2 times daily        10/04/20 2252              Holbert Caples, Boyd Kerbs 10/04/20 2314    Linwood Dibbles, MD 10/05/20 [id number]

## 2020-10-04 NOTE — ED Triage Notes (Signed)
Patient presents with a spider bite. Patient was at work today when he felt something bite him. Patient states that when he smacked his stomach, he noticed it was a spider. Half dollar size red area noted. Patient complaining of itching.

## 2020-10-04 NOTE — Discharge Instructions (Addendum)
1. Medications: hydrocortisone, benadryl as needed, usual home medications 2. Treatment: rest, drink plenty of fluids, keep area clean with warm soap and water 3. Follow Up: Please followup with your primary doctor in 1-2 days for discussion of your diagnoses and further evaluation after today's visit; if you do not have a primary care doctor use the resource guide provided to find one; Please return to the ER for new or worsening symptoms

## 2020-10-23 ENCOUNTER — Other Ambulatory Visit: Payer: Self-pay

## 2020-10-23 ENCOUNTER — Ambulatory Visit: Admission: EM | Admit: 2020-10-23 | Discharge: 2020-10-23 | Discharge status: Against Medical Advice | Payer: Self-pay

## 2020-10-23 NOTE — ED Notes (Signed)
Attempted calling for patient twice in waiting room, tried calling phone once and left a message. No response

## 2020-12-25 ENCOUNTER — Ambulatory Visit
Admission: EM | Admit: 2020-12-25 | Discharge: 2020-12-25 | Disposition: A | Discharge status: Home / Self Care | Payer: Self-pay | Attending: Physician Assistant | Admitting: Physician Assistant

## 2020-12-25 DIAGNOSIS — J101 Influenza due to other identified influenza virus with other respiratory manifestations: Secondary | ICD-10-CM

## 2020-12-25 LAB — POCT INFLUENZA A/B
Influenza A, POC: POSITIVE — AB
Influenza B, POC: NEGATIVE

## 2020-12-25 MED ORDER — OSELTAMIVIR PHOSPHATE 75 MG PO CAPS: 75.0000 mg | ORAL_CAPSULE | Freq: Two times a day (BID) | ORAL | 0 refills | Status: DC

## 2020-12-25 NOTE — ED Provider Notes (Signed)
[facility]    CSN: [id number] Arrival date & time: 12/25/20  1219      History   Chief Complaint Chief Complaint  Patient presents with   Cough   Emesis    HPI Keith Blackwell is a 35 y.o. male.   Patient here today for evaluation of congestion, cough, headaches, body aches that started 2 days ago.  He reports his daughter has the flu.  He states he is also had some vomiting and diarrhea.  He has tried over-the-counter treatment with mild relief.  The history is provided by the patient.  Cough Associated symptoms: headaches, myalgias and sore throat   Associated symptoms: no chills, no ear pain, no eye discharge, no fever and no shortness of breath   Emesis Associated symptoms: cough, diarrhea, headaches, myalgias and sore throat   Associated symptoms: no abdominal pain, no chills and no fever    Past Medical History:  Diagnosis Date   Environmental allergies    Migraine     There are no problems to display for this patient.   Past Surgical History:  Procedure Laterality Date   HERNIA REPAIR         Home Medications    Prior to Admission medications   Medication Sig Start Date End Date Taking? Authorizing Provider  oseltamivir (TAMIFLU) 75 MG capsule Take 1 capsule (75 mg total) by mouth every 12 (twelve) hours. 12/25/20  Yes Tomi Bamberger, PA-C  benzonatate (TESSALON) 100 MG capsule Take 1 capsule (100 mg total) by mouth every 8 (eight) hours. 10/31/15   Bethel Born, PA-C  cetirizine (ZYRTEC) 10 MG tablet Take 10 mg by mouth daily.    [provider]  clotrimazole (LOTRIMIN) 1 % cream Apply to affected area 2 times daily 01/22/16   Liberty Handy, PA-C  diclofenac sodium (VOLTAREN) 1 % GEL Apply 4 g topically 4 (four) times daily. 07/31/16   Trixie Dredge, PA-C  fluticasone (FLONASE) 50 MCG/ACT nasal spray Place 1 spray into both nostrils daily. 10/31/15   Bethel Born, PA-C  hydrocortisone 2.5 % lotion Apply topically 2  (two) times daily. 10/04/20   Muthersbaugh, Dahlia Client, PA-C  ibuprofen (ADVIL,MOTRIN) 800 MG tablet Take 1 tablet (800 mg total) by mouth every 8 (eight) hours as needed. 03/29/16   Lawyer, Cristal Deer, PA-C  meloxicam (MOBIC) 15 MG tablet Take 1 tablet (15 mg total) by mouth daily. Take 1 daily with food. 08/31/17   Arthor Captain, PA-C  methocarbamol (ROBAXIN) 500 MG tablet Take 1-2 tablets (500-1,000 mg total) by mouth every 8 (eight) hours as needed (pain). 07/31/16   Trixie Dredge, PA-C  terbinafine (LAMISIL AT) 1 % cream Apply 1 application topically 2 (two) times daily. Patient not taking: Reported on 01/05/2015 12/05/14   Arthor Captain, PA-C    Family History History reviewed. No pertinent family history.  Social History Social History   Tobacco Use   Smoking status: Every Day    Packs/day: 1.00    Types: Cigarettes   Smokeless tobacco: Never  Substance Use Topics   Alcohol use: No   Drug use: No     Allergies   Patient has no known allergies.   Review of Systems Review of Systems  Constitutional:  Negative for chills and fever.  HENT:  Positive for congestion and sore throat. Negative for ear pain.   Eyes:  Negative for discharge and redness.  Respiratory:  Positive for cough. Negative for shortness of breath.   Gastrointestinal:  Positive for diarrhea, nausea and vomiting. Negative for abdominal pain.  Musculoskeletal:  Positive for myalgias.  Neurological:  Positive for headaches.    Physical Exam Triage Vital Signs ED Triage Vitals  Enc Vitals Group     BP 12/25/20 1306 (!) 158/96     Pulse Rate 12/25/20 1306 (!) 102     Resp 12/25/20 1306 18     Temp 12/25/20 1306 98.2 F (36.8 C)     Temp Source 12/25/20 1306 Oral     SpO2 12/25/20 1306 98 %     Weight --      Height --      Head Circumference --      Peak Flow --      Pain Score 12/25/20 1308 9     Pain Loc --      Pain Edu? --      Excl. in [place]? --    No data found.  Updated Vital Signs BP (!)  158/96 (BP Location: Right Arm)   Pulse (!) 102   Temp 98.2 F (36.8 C) (Oral)   Resp 18   SpO2 98%      Physical Exam Vitals and nursing note reviewed.  Constitutional:      General: He is not in acute distress.    Appearance: Normal appearance. He is not ill-appearing.  HENT:     Head: Normocephalic and atraumatic.     Nose: Congestion present.     Mouth/Throat:     Mouth: Mucous membranes are moist.     Pharynx: Oropharynx is clear. No oropharyngeal exudate or posterior oropharyngeal erythema.  Eyes:     Conjunctiva/sclera: Conjunctivae normal.  Cardiovascular:     Rate and Rhythm: Normal rate and regular rhythm.     Heart sounds: Normal heart sounds. No murmur heard. Pulmonary:     Effort: Pulmonary effort is normal. No respiratory distress.     Breath sounds: Normal breath sounds. No wheezing, rhonchi or rales.  Skin:    General: Skin is warm and dry.  Neurological:     Mental Status: He is alert.  Psychiatric:        Mood and Affect: Mood normal.        Thought Content: Thought content normal.     UC Treatments / Results  Labs (all labs ordered are listed, but only abnormal results are displayed) Labs Reviewed  POCT INFLUENZA A/B    EKG   Radiology No results found.  Procedures Procedures (including critical care time)  Medications Ordered in UC Medications - No data to display  Initial Impression / Assessment and Plan / UC Course  I have reviewed the triage vital signs and the nursing notes.  Pertinent labs & imaging results that were available during my care of the patient were reviewed by me and considered in my medical decision making (see chart for details).    Flu test positive in office.  Tamiflu prescribed.  Recommended symptomatic treatment, with increase fluids and rest.  Encouraged follow-up if symptoms fail to improve or worsen.  Final Clinical Impressions(s) / UC Diagnoses   Final diagnoses:  Influenza A   Discharge Instructions    None    ED Prescriptions     Medication Sig Dispense Auth. Provider   oseltamivir (TAMIFLU) 75 MG capsule Take 1 capsule (75 mg total) by mouth every 12 (twelve) hours. 10 capsule Tomi Bamberger, PA-C      PDMP not reviewed this encounter.   Tomi Bamberger,  PA-C 12/25/20 1340

## 2020-12-25 NOTE — ED Triage Notes (Signed)
2 day h/o HA, body aches, congestion, cough, emesis and diarrhea. Has been taking robitussin and sudafed w/o relief. Negative at home covid test. Daughter has the flu.

## 2021-02-25 ENCOUNTER — Emergency Department (HOSPITAL_BASED_OUTPATIENT_CLINIC_OR_DEPARTMENT_OTHER)
Admission: EM | Admit: 2021-02-25 | Discharge: 2021-02-25 | Disposition: A | Discharge status: Home / Self Care | Payer: Self-pay | Attending: Emergency Medicine | Admitting: Emergency Medicine

## 2021-02-25 ENCOUNTER — Other Ambulatory Visit: Payer: Self-pay

## 2021-02-25 ENCOUNTER — Encounter (HOSPITAL_BASED_OUTPATIENT_CLINIC_OR_DEPARTMENT_OTHER): Payer: Self-pay

## 2021-02-25 ENCOUNTER — Encounter: Payer: Self-pay | Admitting: Emergency Medicine

## 2021-02-25 ENCOUNTER — Emergency Department (HOSPITAL_BASED_OUTPATIENT_CLINIC_OR_DEPARTMENT_OTHER): Payer: Self-pay | Admitting: Radiology

## 2021-02-25 ENCOUNTER — Ambulatory Visit
Admission: EM | Admit: 2021-02-25 | Discharge: 2021-02-25 | Disposition: A | Discharge status: Home / Self Care | Payer: Self-pay | Attending: Internal Medicine | Admitting: Internal Medicine

## 2021-02-25 DIAGNOSIS — R079 Chest pain, unspecified: Secondary | ICD-10-CM

## 2021-02-25 DIAGNOSIS — Z20822 Contact with and (suspected) exposure to covid-19: Secondary | ICD-10-CM | POA: Insufficient documentation

## 2021-02-25 DIAGNOSIS — R0789 Other chest pain: Secondary | ICD-10-CM

## 2021-02-25 DIAGNOSIS — R197 Diarrhea, unspecified: Secondary | ICD-10-CM | POA: Insufficient documentation

## 2021-02-25 DIAGNOSIS — R112 Nausea with vomiting, unspecified: Secondary | ICD-10-CM | POA: Insufficient documentation

## 2021-02-25 LAB — URINALYSIS, ROUTINE W REFLEX MICROSCOPIC
Bilirubin Urine: NEGATIVE
Glucose, UA: NEGATIVE mg/dL
Hgb urine dipstick: NEGATIVE
Ketones, ur: NEGATIVE mg/dL
Leukocytes,Ua: NEGATIVE
Nitrite: NEGATIVE
Protein, ur: 100 mg/dL — AB
Specific Gravity, Urine: 1.033 — ABNORMAL HIGH (ref 1.005–1.030)
pH: 6 (ref 5.0–8.0)

## 2021-02-25 LAB — HEPATIC FUNCTION PANEL
ALT: 24 U/L (ref 0–44)
AST: 22 U/L (ref 15–41)
Albumin: 4.2 g/dL (ref 3.5–5.0)
Alkaline Phosphatase: 48 U/L (ref 38–126)
Bilirubin, Direct: 0.2 mg/dL (ref 0.0–0.2)
Indirect Bilirubin: 0.5 mg/dL (ref 0.3–0.9)
Total Bilirubin: 0.7 mg/dL (ref 0.3–1.2)
Total Protein: 8 g/dL (ref 6.5–8.1)

## 2021-02-25 LAB — BASIC METABOLIC PANEL
Anion gap: 8 (ref 5–15)
BUN: 17 mg/dL (ref 6–20)
CO2: 27 mmol/L (ref 22–32)
Calcium: 9.5 mg/dL (ref 8.9–10.3)
Chloride: 104 mmol/L (ref 98–111)
Creatinine, Ser: 1.19 mg/dL (ref 0.61–1.24)
GFR, Estimated: >60 mL/min (ref >60)
Glucose, Bld: 80 mg/dL (ref 70–99)
Potassium: 3.6 mmol/L (ref 3.5–5.1)
Sodium: 139 mmol/L (ref 135–145)

## 2021-02-25 LAB — CBC
HCT: 39.6 % (ref 39.0–52.0)
Hemoglobin: 12 g/dL — ABNORMAL LOW (ref 13.0–17.0)
MCH: 23.2 pg — ABNORMAL LOW (ref 26.0–34.0)
MCHC: 30.3 g/dL (ref 30.0–36.0)
MCV: 76.4 fL — ABNORMAL LOW (ref 80.0–100.0)
Platelets: 144 10*3/uL — ABNORMAL LOW (ref 150–400)
RBC: 5.18 MIL/uL (ref 4.22–5.81)
RDW: 15 % (ref 11.5–15.5)
WBC: 3.2 10*3/uL — ABNORMAL LOW (ref 4.0–10.5)
nRBC: 0 % (ref 0.0–0.2)

## 2021-02-25 LAB — TROPONIN I (HIGH SENSITIVITY)
Troponin I (High Sensitivity): 4 ng/L (ref <18)
Troponin I (High Sensitivity): 4 ng/L (ref <18)

## 2021-02-25 LAB — RESP PANEL BY RT-PCR (FLU A&B, COVID) ARPGX2
Influenza A by PCR: NEGATIVE
Influenza B by PCR: NEGATIVE
SARS Coronavirus 2 by RT PCR: NEGATIVE

## 2021-02-25 MED ORDER — ONDANSETRON HCL 4 MG/2ML IJ SOLN
4.0000 mg | Freq: Once | INTRAMUSCULAR | Status: AC
  Administered 2021-02-25: 4 mg via INTRAVENOUS
  Filled 2021-02-25: qty 2

## 2021-02-25 MED ORDER — ONDANSETRON 4 MG PO TBDP: 4.0000 mg | ORAL_TABLET | Freq: Three times a day (TID) | ORAL | 0 refills | Status: DC | PRN

## 2021-02-25 MED ORDER — SODIUM CHLORIDE 0.9 % IV BOLUS
1000.0000 mL | Freq: Once | INTRAVENOUS | Status: AC
  Administered 2021-02-25: 1000 mL via INTRAVENOUS

## 2021-02-25 MED ORDER — NAPROXEN 500 MG PO TABS
500.0000 mg | ORAL_TABLET | Freq: Two times a day (BID) | ORAL | 0 refills | Status: DC
Start: 2021-02-25 — End: 2022-09-17

## 2021-02-25 NOTE — ED Provider Notes (Signed)
[facility]    CSN: [id number] Arrival date & time: 02/25/21  0910      History   Chief Complaint Chief Complaint  Patient presents with   Chest Pain    HPI Keith Blackwell is a 36 y.o. male.   Patient presents with chest pain that started approximately 4 days ago.  Chest pain is centralized and does not radiate.  Pain is rated 9/10 on pain scale and is intermittent.  He reports that he has taken ibuprofen and Tylenol with no improvement, although he took aspirin approximately an hour to 2 hours prior to arrival that caused improvement of pain and pain decreased to 4/10 on pain scale.  Patient is not able to characterize pain.  He has had associated nausea, headache, and diaphoresis as well.  Denies any apparent injury but reports that he has been lifting some heavier boxes at his job over the weekend.  Denies shortness of breath.  Denies any history of cardiac problems.   Chest Pain  Past Medical History:  Diagnosis Date   Environmental allergies    Migraine     There are no problems to display for this patient.   Past Surgical History:  Procedure Laterality Date   HERNIA REPAIR         Home Medications    Prior to Admission medications   Medication Sig Start Date End Date Taking? Authorizing Provider  benzonatate (TESSALON) 100 MG capsule Take 1 capsule (100 mg total) by mouth every 8 (eight) hours. 10/31/15   Recardo Evangelist, PA-C  cetirizine (ZYRTEC) 10 MG tablet Take 10 mg by mouth daily.    [provider]  clotrimazole (LOTRIMIN) 1 % cream Apply to affected area 2 times daily 01/22/16   Kinnie Feil, PA-C  diclofenac sodium (VOLTAREN) 1 % GEL Apply 4 g topically 4 (four) times daily. 07/31/16   Clayton Bibles, PA-C  fluticasone (FLONASE) 50 MCG/ACT nasal spray Place 1 spray into both nostrils daily. 10/31/15   Recardo Evangelist, PA-C  hydrocortisone 2.5 % lotion Apply topically 2 (two) times daily. 10/04/20   Muthersbaugh, Jarrett Soho, PA-C   ibuprofen (ADVIL,MOTRIN) 800 MG tablet Take 1 tablet (800 mg total) by mouth every 8 (eight) hours as needed. 03/29/16   Lawyer, Harrell Gave, PA-C  meloxicam (MOBIC) 15 MG tablet Take 1 tablet (15 mg total) by mouth daily. Take 1 daily with food. 08/31/17   Margarita Mail, PA-C  methocarbamol (ROBAXIN) 500 MG tablet Take 1-2 tablets (500-1,000 mg total) by mouth every 8 (eight) hours as needed (pain). 07/31/16   Clayton Bibles, PA-C  oseltamivir (TAMIFLU) 75 MG capsule Take 1 capsule (75 mg total) by mouth every 12 (twelve) hours. 12/25/20   Francene Finders, PA-C  terbinafine (LAMISIL AT) 1 % cream Apply 1 application topically 2 (two) times daily. Patient not taking: Reported on 01/05/2015 12/05/14   Margarita Mail, PA-C    Family History History reviewed. No pertinent family history.  Social History Social History   Tobacco Use   Smoking status: Every Day    Packs/day: 1.00    Types: Cigarettes   Smokeless tobacco: Never  Substance Use Topics   Alcohol use: No   Drug use: No     Allergies   Patient has no known allergies.   Review of Systems Review of Systems Per HPI  Physical Exam Triage Vital Signs ED Triage Vitals  Enc Vitals Group     BP 02/25/21 0926 (!) 147/109  Pulse Rate 02/25/21 0926 (!) 105     Resp 02/25/21 0926 16     Temp 02/25/21 0926 97.7 F (36.5 C)     Temp Source 02/25/21 0926 Oral     SpO2 02/25/21 0926 97 %     Weight --      Height --      Head Circumference --      Peak Flow --      Pain Score 02/25/21 0928 4     Pain Loc --      Pain Edu? --      Excl. in [place]? --    No data found.  Updated Vital Signs BP (!) 147/109 (BP Location: Right Arm)    Pulse (!) 105    Temp 97.7 F (36.5 C) (Oral)    Resp 16    SpO2 97%   Visual Acuity Right Eye Distance:   Left Eye Distance:   Bilateral Distance:    Right Eye Near:   Left Eye Near:    Bilateral Near:     Physical Exam Constitutional:      General: He is not in acute distress.     Appearance: Normal appearance. He is not toxic-appearing or diaphoretic.  HENT:     Head: Normocephalic and atraumatic.  Eyes:     Extraocular Movements: Extraocular movements intact.     Conjunctiva/sclera: Conjunctivae normal.     Pupils: Pupils are equal, round, and reactive to light.  Cardiovascular:     Rate and Rhythm: Normal rate and regular rhythm.     Pulses: Normal pulses.     Heart sounds: Normal heart sounds.  Pulmonary:     Effort: Pulmonary effort is normal. No respiratory distress.     Breath sounds: Normal breath sounds.  Neurological:     General: No focal deficit present.     Mental Status: He is alert and oriented to person, place, and time. Mental status is at baseline.     Cranial Nerves: Cranial nerves 2-12 are intact.     Sensory: Sensation is intact.     Motor: Motor function is intact.     Coordination: Coordination is intact.     Gait: Gait is intact.  Psychiatric:        Mood and Affect: Mood normal.        Behavior: Behavior normal.        Thought Content: Thought content normal.        Judgment: Judgment normal.     UC Treatments / Results  Labs (all labs ordered are listed, but only abnormal results are displayed) Labs Reviewed - No data to display  EKG   Radiology No results found.  Procedures Procedures (including critical care time)  Medications Ordered in UC Medications - No data to display  Initial Impression / Assessment and Plan / UC Course  I have reviewed the triage vital signs and the nursing notes.  Pertinent labs & imaging results that were available during my care of the patient were reviewed by me and considered in my medical decision making (see chart for details).     EKG was normal sinus rhythm but do think that patient needs more extensive evaluation giving symptoms, characteristic of chest pain, and elevated blood pressure reading in urgent care.  Patient was advised that he would need to go to the hospital for  further evaluation and management.  Suggested EMS transport but patient declined.  Patient left to go the hospital via  self transport. Final Clinical Impressions(s) / UC Diagnoses   Final diagnoses:  Other chest pain     Discharge Instructions      Please go to the hospital as soon as you leave urgent care for further evaluation and management.    ED Prescriptions   None    PDMP not reviewed this encounter.   Teodora Medici, [place] 02/25/21 1034

## 2021-02-25 NOTE — ED Triage Notes (Addendum)
Central chest pain, intermittent, starting Friday. States he had been picking up heavier boxes at his job over the weekend. Non-radiating. States the ibuprofen and tylenol he's been taking didn't help. But that he took an aspirin today (unsure of dosage) and it took the pain away. Reports associated nausea and diaphoresis. Denies shortness of breath, left arm numbness/tingling. Reports family hx of MI, HTN, CVA. CP currently 4/10

## 2021-02-25 NOTE — ED Triage Notes (Signed)
Pt reports intermittent chest pain since Friday, thinks he could have pulled a muscle at work.  Pt also c/o nausea, vomiting, and diarrhea x 2 weeks.

## 2021-02-25 NOTE — ED Notes (Signed)
ED Provider at bedside. 

## 2021-02-25 NOTE — Discharge Instructions (Signed)
Please go to the hospital as soon as you leave urgent care for further evaluation and management. 

## 2021-02-25 NOTE — Discharge Instructions (Addendum)
It was pleasure taking care of you today.  As discussed, all your labs are reassuring.  I am sending you home with nausea medication.  Take as needed.  Your cardiac labs were normal.  Chest x-ray did not show any signs of pneumonia.  I am sending you home with pain medication for your chest pain.  Take as needed.  I have included the number for [facility].  Call to establish care.  Return to the ER for any worsening symptoms.

## 2021-02-25 NOTE — ED Notes (Addendum)
Pt note submitted in chart by accident

## 2021-02-25 NOTE — ED Notes (Signed)
Patient aware urine sample needed for Urinalysis. Patient was requesting to know why urine sample needed, RN explained it was a regular urinalysis to check for infection or blood in urine. Patient reports he feels okay at this time and denies worsening pain or nausea. Will continue to monitor

## 2021-02-25 NOTE — ED Provider Notes (Signed)
[facility]   CSN: [id number] Arrival date & time: 02/25/21  1104     History  Chief Complaint  Patient presents with   Chest Pain   Nausea   Vomiting   Diarrhea    Keith ItoJames J Triggs is a 36 y.o. male with no significant past medical history who presents to the ED due to chest pain, nausea, vomiting, and diarrhea.  Patient states nausea, vomiting, diarrhea has been present for the past 2 weeks.  He admits to 1 episode of nonbloody, nonbilious emesis within the past 48 hours.  Denies hematemesis, melena, and hematochezia.  Admits to associated generalized abdominal pain.  No urinary symptoms.  No previous abdominal operations.  Denies cough and sore throat.  No sick contacts or known COVID exposures.  Patient also endorses intermittent, central chest pain x 4 days. He describes chest pain as a tightness sensation. Chest pain worse when lying flat.  Denies associated shortness of breath.  No history of blood clots, recent surgeries, recent long immobilizations, and hormonal treatments.  Denies associated lower extremity edema.  Patient was evaluated at urgent care prior to arrival and sent to the ED for further evaluation.  Patient states he was given aspirin prior to arrival with improvement in symptoms.  Denies cardiac history.  No previous CVA.  No tobacco use.       Home Medications Prior to Admission medications   Medication Sig Start Date End Date Taking? Authorizing Provider  naproxen (NAPROSYN) 500 MG tablet Take 1 tablet (500 mg total) by mouth 2 (two) times daily. 02/25/21  Yes Letesha Klecker C, PA-C  ondansetron (ZOFRAN-ODT) 4 MG disintegrating tablet Take 1 tablet (4 mg total) by mouth every 8 (eight) hours as needed for nausea or vomiting. 02/25/21  Yes Robel Wuertz, Merla Richesaroline C, PA-C  benzonatate (TESSALON) 100 MG capsule Take 1 capsule (100 mg total) by mouth every 8 (eight) hours. 10/31/15   Bethel BornGekas, Kelly Marie, PA-C  cetirizine  (ZYRTEC) 10 MG tablet Take 10 mg by mouth daily.    [provider]  clotrimazole (LOTRIMIN) 1 % cream Apply to affected area 2 times daily 01/22/16   Liberty HandyGibbons, Claudia J, PA-C  diclofenac sodium (VOLTAREN) 1 % GEL Apply 4 g topically 4 (four) times daily. 07/31/16   Trixie DredgeWest, Emily, PA-C  fluticasone (FLONASE) 50 MCG/ACT nasal spray Place 1 spray into both nostrils daily. 10/31/15   Bethel BornGekas, Kelly Marie, PA-C  hydrocortisone 2.5 % lotion Apply topically 2 (two) times daily. 10/04/20   Muthersbaugh, Dahlia ClientHannah, PA-C  ibuprofen (ADVIL,MOTRIN) 800 MG tablet Take 1 tablet (800 mg total) by mouth every 8 (eight) hours as needed. 03/29/16   Lawyer, Cristal Deerhristopher, PA-C  meloxicam (MOBIC) 15 MG tablet Take 1 tablet (15 mg total) by mouth daily. Take 1 daily with food. 08/31/17   Arthor CaptainHarris, Abigail, PA-C  methocarbamol (ROBAXIN) 500 MG tablet Take 1-2 tablets (500-1,000 mg total) by mouth every 8 (eight) hours as needed (pain). 07/31/16   Trixie DredgeWest, Emily, PA-C  oseltamivir (TAMIFLU) 75 MG capsule Take 1 capsule (75 mg total) by mouth every 12 (twelve) hours. 12/25/20   Tomi BambergerMyers, Rebecca F, PA-C  terbinafine (LAMISIL AT) 1 % cream Apply 1 application topically 2 (two) times daily. Patient not taking: Reported on 01/05/2015 12/05/14   Arthor CaptainHarris, Abigail, PA-C      Allergies    Patient has no known allergies.    Review of Systems   Review of Systems  Constitutional:  Negative for chills and fever.  Respiratory:  Negative for shortness of breath.   Cardiovascular:  Positive for chest pain. Negative for leg swelling.  Gastrointestinal:  Positive for abdominal pain, diarrhea, nausea and vomiting.  Genitourinary:  Negative for dysuria.   Physical Exam Updated Vital Signs BP 121/82    Pulse 75    Temp 98.7 F (37.1 C) (Oral)    Resp 16    Ht 5\' 9"  (1.753 m)    Wt 117.9 kg    SpO2 99%    BMI 38.40 kg/m  Physical Exam Vitals and nursing note reviewed.  Constitutional:      General: He is not in acute distress.    Appearance:  He is not ill-appearing.  HENT:     Head: Normocephalic.  Eyes:     Pupils: Pupils are equal, round, and reactive to light.  Cardiovascular:     Rate and Rhythm: Normal rate and regular rhythm.     Pulses: Normal pulses.     Heart sounds: Normal heart sounds. No murmur heard.   No friction rub. No gallop.  Pulmonary:     Effort: Pulmonary effort is normal.     Breath sounds: Normal breath sounds.  Chest:     Comments: Reproducible tenderness throughout anterior chest wall Abdominal:     General: Abdomen is flat. There is no distension.     Palpations: Abdomen is soft.     Tenderness: There is no abdominal tenderness. There is no guarding or rebound.     Comments: Abdomen soft, nondistended, nontender to palpation in all quadrants without guarding or peritoneal signs. No rebound.   Musculoskeletal:        General: Normal range of motion.     Cervical back: Neck supple.     Comments: No lower extremity edema.  Skin:    General: Skin is warm and dry.  Neurological:     General: No focal deficit present.     Mental Status: He is alert.  Psychiatric:        Mood and Affect: Mood normal.        Behavior: Behavior normal.    ED Results / Procedures / Treatments   Labs (all labs ordered are listed, but only abnormal results are displayed) Labs Reviewed  CBC - Abnormal; Notable for the following components:      Result Value   WBC 3.2 (*)    Hemoglobin 12.0 (*)    MCV 76.4 (*)    MCH 23.2 (*)    Platelets 144 (*)    All other components within normal limits  URINALYSIS, ROUTINE W REFLEX MICROSCOPIC - Abnormal; Notable for the following components:   Specific Gravity, Urine 1.033 (*)    Protein, ur 100 (*)    All other components within normal limits  RESP PANEL BY RT-PCR (FLU A&B, COVID) ARPGX2  BASIC METABOLIC PANEL  HEPATIC FUNCTION PANEL  TROPONIN I (HIGH SENSITIVITY)  TROPONIN I (HIGH SENSITIVITY)    EKG None  Radiology DG Chest 2 View  Result Date:  02/25/2021 CLINICAL DATA:  Chest pain since lifting boxes 3 days ago. EXAM: CHEST - 2 VIEW COMPARISON:  01/05/2015 FINDINGS: The heart size and mediastinal contours are within normal limits. Both lungs are clear. The visualized skeletal structures are unremarkable. IMPRESSION: No active cardiopulmonary disease. Electronically Signed   By: 14/03/2014 M.D.   On: 02/25/2021 12:02    Procedures Procedures    Medications Ordered in ED Medications  sodium chloride 0.9 % bolus 1,000 mL (1,000  mLs Intravenous New Bag/Given 02/25/21 1246)  ondansetron (ZOFRAN) injection 4 mg (4 mg Intravenous Given 02/25/21 1244)    ED Course/ Medical Decision Making/ A&P Clinical Course as of 02/25/21 1415  Mon Feb 25, 2021  1230 WBC(!): 3.2 [CA]  1314 Hemoglobin(!): 12.0 [CA]  1314 SARS Coronavirus 2 by RT PCR: NEGATIVE [CA]    Clinical Course User Index [CA] Mannie StabileAberman, Derelle Cockrell C, PA-C                           Medical Decision Making Amount and/or Complexity of Data Reviewed Independent Historian: friend    Details: GF at bedside provided history External Data Reviewed: notes.    Details: UC note Labs: ordered. Decision-making details documented in ED Course. Radiology: ordered and independent interpretation performed. Decision-making details documented in ED Course. ECG/medicine tests: ordered and independent interpretation performed. Decision-making details documented in ED Course.  Risk OTC drugs. Prescription drug management.   36 year old male presents to the ED due to nausea, vomiting, and diarrhea x2 weeks.  He also endorses central chest pain x4 days worse when lying flat and palpation.  Upon arrival, patient afebrile with O2 saturation at 100%.  Patient mildly tachycardic however, during my initial evaluation, patient's heart rate in the 80s and 90s on cardiac monitor.  Patient in no acute distress.  Benign physical exam.  Abdomen soft, nondistended, nontender.  Low suspicion for acute  abdomen.  Lungs clear to auscultation bilaterally.  No lower extremity edema.  No evidence of DVT on exam.  Low suspicion for PE/DVT given tachycardia resolved.  Suspect nausea, vomiting, diarrhea likely due to gastroenteritis.  Routine labs ordered to rule out electrolyte abnormalities.  COVID/influenza test to rule out infection.  Troponin to rule out ACS. IV fluids and Zofran given.  This patient presents to the ED for concern of nausea, vomiting, diarrhea, and chest pain, this involves an extensive number of treatment options, and is a complaint that carries with it a high risk of complications and morbidity.  The differential diagnosis includes gastroenteritis, ACS, PE/DVT, dissection, viral infection, acute abdomen  CBC significant for leukopenia 3.2 and anemia with hemoglobin at 12.  Mild thrombocytopenia at 144.  BMP unremarkable.  Normal renal function.  No major electrolyte derangements.  Hepatic function panel normal.  Initial troponin normal at 4.  Will obtain delta troponin to rule out ACS.  COVID/influenza negative.  Chest x-ray personally reviewed and interpreted which is negative for any acute abnormalities.  Agree with radiology interpretation.  Cardiac monitoring reviewed and intrepreted which demonstrates normal sinus rhythm.  No signs of acute ischemia.  Delta troponin flat.  Low suspicion for ACS.  Presentation nonconcerning for aortic dissection.  Suspect chest pain related to MSK etiology given reproducible nature on exam.  Patient discharged with naproxen.  Suspect nausea, vomiting, diarrhea related to gastroenteritis.  Patient discharged with Zofran.  Over-the-counter medications discussed with patient.  Considered admission however, patient's labs are reassuring and no signs of AKI or severe dehydration so felt patient is stable for discharge Strict ED precautions discussed with patient. Patient states understanding and agrees to plan. Patient discharged home in no acute distress and  stable vitals         Final Clinical Impression(s) / ED Diagnoses Final diagnoses:  Nausea vomiting and diarrhea  Nonspecific chest pain    Rx / DC Orders ED Discharge Orders          Ordered    ondansetron (  ZOFRAN-ODT) 4 MG disintegrating tablet  Every 8 hours PRN        02/25/21 1411    naproxen (NAPROSYN) 500 MG tablet  2 times daily        02/25/21 1412              Mannie Stabile, PA-C 02/25/21 1416    Tanda Rockers A, DO 02/26/21 0901

## 2021-05-02 ENCOUNTER — Other Ambulatory Visit: Payer: Self-pay

## 2021-05-02 ENCOUNTER — Ambulatory Visit
Admission: EM | Admit: 2021-05-02 | Discharge: 2021-05-02 | Disposition: A | Discharge status: Home / Self Care | Payer: Self-pay | Attending: Internal Medicine | Admitting: Internal Medicine

## 2021-05-02 ENCOUNTER — Encounter: Payer: Self-pay | Admitting: Emergency Medicine

## 2021-05-02 DIAGNOSIS — R21 Rash and other nonspecific skin eruption: Secondary | ICD-10-CM

## 2021-05-02 DIAGNOSIS — L509 Urticaria, unspecified: Secondary | ICD-10-CM

## 2021-05-02 MED ORDER — PREDNISONE 10 MG (21) PO TBPK: ORAL_TABLET | Freq: Every day | ORAL | 0 refills | Status: DC

## 2021-05-02 MED ORDER — HYDROXYZINE HCL 25 MG PO TABS: 25.0000 mg | ORAL_TABLET | Freq: Four times a day (QID) | ORAL | 0 refills | Status: DC | PRN

## 2021-05-02 NOTE — Discharge Instructions (Addendum)
You have been prescribed 2 medications to help alleviate symptoms.  Please be advised that hydroxyzine can cause drowsiness.  Follow-up with dermatology for further evaluation and management. ?

## 2021-05-02 NOTE — ED Triage Notes (Signed)
Patient c/o rash on arms, neck, left leg and abdomen x 2 weeks.  The areas are very itchy, red with crust over them.  No new lotions, detergents, soaps.  Patient has been applying hydrocortisone and benadryl cream without much relief. ?

## 2021-05-02 NOTE — ED Provider Notes (Signed)
?[facility] ? ? ? ?CSN: [id number] ?Arrival date & time: 05/02/21  1438 ? ? ?  ? ?History   ?Chief Complaint ?Chief Complaint  ?Patient presents with  ? Rash  ? ? ?HPI ?Keith Blackwell is a 36 y.o. male.  ? ?Patient presents with itchy rash that is present to bilateral arms, posterior neck, right posterior leg, abdomen that has been present for approximately 2 weeks.  Patient reports that he has had a similar intermittent rash over the past few months.  Denies any changes to the environment including lotions, soaps, detergents, foods, etc.  Denies any associated fevers.  Patient has been using over-the-counter creams with minimal improvement in symptoms. ? ? ?Rash ? ?Past Medical History:  ?Diagnosis Date  ? Environmental allergies   ? Migraine   ? ? ?There are no problems to display for this patient. ? ? ?Past Surgical History:  ?Procedure Laterality Date  ? HERNIA REPAIR    ? ? ? ? ? ?Home Medications   ? ?Prior to Admission medications   ?Medication Sig Start Date End Date Taking? Authorizing Provider  ?hydrOXYzine (ATARAX) 25 MG tablet Take 1 tablet (25 mg total) by mouth every 6 (six) hours as needed for itching. 05/02/21  Yes Gustavus BryantMound, Daviyon Widmayer E, FNP  ?predniSONE (STERAPRED UNI-PAK 21 TAB) 10 MG (21) TBPK tablet Take by mouth daily. Take 6 tabs by mouth daily  for 2 days, then 5 tabs for 2 days, then 4 tabs for 2 days, then 3 tabs for 2 days, 2 tabs for 2 days, then 1 tab by mouth daily for 2 days 05/02/21  Yes Kerman Pfost, Rolly SalterHaley E, FNP  ?benzonatate (TESSALON) 100 MG capsule Take 1 capsule (100 mg total) by mouth every 8 (eight) hours. 10/31/15   Bethel BornGekas, Kelly Marie, PA-C  ?cetirizine (ZYRTEC) 10 MG tablet Take 10 mg by mouth daily.    [provider]  ?clotrimazole (LOTRIMIN) 1 % cream Apply to affected area 2 times daily 01/22/16   Liberty HandyGibbons, Claudia J, PA-C  ?diclofenac sodium (VOLTAREN) 1 % GEL Apply 4 g topically 4 (four) times daily. 07/31/16   Trixie DredgeWest, Emily, PA-C  ?fluticasone (FLONASE) 50 MCG/ACT  nasal spray Place 1 spray into both nostrils daily. 10/31/15   Bethel BornGekas, Kelly Marie, PA-C  ?hydrocortisone 2.5 % lotion Apply topically 2 (two) times daily. 10/04/20   Muthersbaugh, Dahlia ClientHannah, PA-C  ?ibuprofen (ADVIL,MOTRIN) 800 MG tablet Take 1 tablet (800 mg total) by mouth every 8 (eight) hours as needed. 03/29/16   Lawyer, Cristal Deerhristopher, PA-C  ?meloxicam (MOBIC) 15 MG tablet Take 1 tablet (15 mg total) by mouth daily. Take 1 daily with food. 08/31/17   Arthor CaptainHarris, Abigail, PA-C  ?methocarbamol (ROBAXIN) 500 MG tablet Take 1-2 tablets (500-1,000 mg total) by mouth every 8 (eight) hours as needed (pain). 07/31/16   Trixie DredgeWest, Emily, PA-C  ?naproxen (NAPROSYN) 500 MG tablet Take 1 tablet (500 mg total) by mouth 2 (two) times daily. 02/25/21   Mannie StabileAberman, Caroline C, PA-C  ?ondansetron (ZOFRAN-ODT) 4 MG disintegrating tablet Take 1 tablet (4 mg total) by mouth every 8 (eight) hours as needed for nausea or vomiting. 02/25/21   Mannie StabileAberman, Caroline C, PA-C  ?oseltamivir (TAMIFLU) 75 MG capsule Take 1 capsule (75 mg total) by mouth every 12 (twelve) hours. 12/25/20   Tomi BambergerMyers, Rebecca F, PA-C  ?terbinafine (LAMISIL AT) 1 % cream Apply 1 application topically 2 (two) times daily. ?Patient not taking: Reported on 01/05/2015 12/05/14   Arthor CaptainHarris, Abigail, PA-C  ? ? ?Family History ?  History reviewed. No pertinent family history. ? ?Social History ?Social History  ? ?Tobacco Use  ? Smoking status: Every Day  ?  Packs/day: 1.00  ?  Types: Cigarettes  ? Smokeless tobacco: Never  ?Substance Use Topics  ? Alcohol use: No  ? Drug use: No  ? ? ? ?Allergies   ?Patient has no known allergies. ? ? ?Review of Systems ?Review of Systems ?Per HPI ? ?Physical Exam ?Triage Vital Signs ?ED Triage Vitals  ?Enc Vitals Group  ?   BP 05/02/21 1539 (!) 137/92  ?   Pulse Rate 05/02/21 1539 93  ?   Resp 05/02/21 1539 18  ?   Temp 05/02/21 1539 98.2 ?F (36.8 ?C)  ?   Temp Source 05/02/21 1539 Oral  ?   SpO2 05/02/21 1539 97 %  ?   Weight 05/02/21 1540 257 lb (116.6 kg)  ?   Height  05/02/21 1540 5\' 10"  (1.778 m)  ?   Head Circumference --   ?   Peak Flow --   ?   Pain Score 05/02/21 1540 0  ?   Pain Loc --   ?   Pain Edu? --   ?   Excl. in GC? --   ? ?No data found. ? ?Updated Vital Signs ?BP (!) 137/92 (BP Location: Left Arm)   Pulse 93   Temp 98.2 ?F (36.8 ?C) (Oral)   Resp 18   Ht 5\' 10"  (1.778 m)   Wt 257 lb (116.6 kg)   SpO2 97%   BMI 36.88 kg/m?  ? ?Visual Acuity ?Right Eye Distance:   ?Left Eye Distance:   ?Bilateral Distance:   ? ?Right Eye Near:   ?Left Eye Near:    ?Bilateral Near:    ? ?Physical Exam ?Constitutional:   ?   General: He is not in acute distress. ?   Appearance: Normal appearance. He is not toxic-appearing or diaphoretic.  ?HENT:  ?   Head: Normocephalic and atraumatic.  ?Eyes:  ?   Extraocular Movements: Extraocular movements intact.  ?   Conjunctiva/sclera: Conjunctivae normal.  ?Pulmonary:  ?   Effort: Pulmonary effort is normal.  ?Skin: ?   Findings: Rash present.  ?   Comments: Maculopapular erythematous rash present to bilateral arms.  Scaly erythematous rash present to abdomen and posterior neck.  ?Neurological:  ?   General: No focal deficit present.  ?   Mental Status: He is alert and oriented to person, place, and time. Mental status is at baseline.  ?Psychiatric:     ?   Mood and Affect: Mood normal.     ?   Behavior: Behavior normal.     ?   Thought Content: Thought content normal.     ?   Judgment: Judgment normal.  ? ? ? ?UC Treatments / Results  ?Labs ?(all labs ordered are listed, but only abnormal results are displayed) ?Labs Reviewed - No data to display ? ?EKG ? ? ?Radiology ?No results found. ? ?Procedures ?Procedures (including critical care time) ? ?Medications Ordered in UC ?Medications - No data to display ? ?Initial Impression / Assessment and Plan / UC Course  ?I have reviewed the triage vital signs and the nursing notes. ? ?Pertinent labs & imaging results that were available during my care of the patient were reviewed by me and  considered in my medical decision making (see chart for details). ? ?  ? ?Differential diagnoses include atopic dermatitis versus psoriasis versus allergic contact dermatitis.  Will treat with prednisone and hydroxyzine.  Patient to follow-up with provided contact information for dermatology for further evaluation and management.  Discussed return precautions.  Patient verbalized understanding and was agreeable with plan. ?Final Clinical Impressions(s) / UC Diagnoses  ? ?Final diagnoses:  ?Rash and nonspecific skin eruption  ?Urticaria  ? ? ? ?Discharge Instructions   ? ?  ?You have been prescribed 2 medications to help alleviate symptoms.  Please be advised that hydroxyzine can cause drowsiness.  Follow-up with dermatology for further evaluation and management. ? ? ? ?ED Prescriptions   ? ? Medication Sig Dispense Auth. Provider  ? predniSONE (STERAPRED UNI-PAK 21 TAB) 10 MG (21) TBPK tablet Take by mouth daily. Take 6 tabs by mouth daily  for 2 days, then 5 tabs for 2 days, then 4 tabs for 2 days, then 3 tabs for 2 days, 2 tabs for 2 days, then 1 tab by mouth daily for 2 days 42 tablet [place], Acie Fredrickson, [place]  ? hydrOXYzine (ATARAX) 25 MG tablet Take 1 tablet (25 mg total) by mouth every 6 (six) hours as needed for itching. 12 tablet Ervin Knack E, [place]  ? ?  ? ?PDMP not reviewed this encounter. ?  ?Gustavus Bryant, [place] ?05/02/21 1553 ? ?

## 2021-05-13 ENCOUNTER — Encounter (HOSPITAL_BASED_OUTPATIENT_CLINIC_OR_DEPARTMENT_OTHER): Payer: Self-pay

## 2021-05-13 ENCOUNTER — Other Ambulatory Visit: Payer: Self-pay

## 2021-05-13 ENCOUNTER — Emergency Department (HOSPITAL_BASED_OUTPATIENT_CLINIC_OR_DEPARTMENT_OTHER)
Admission: EM | Admit: 2021-05-13 | Discharge: 2021-05-13 | Disposition: A | Discharge status: Home / Self Care | Payer: Self-pay | Attending: Emergency Medicine | Admitting: Emergency Medicine

## 2021-05-13 DIAGNOSIS — Z202 Contact with and (suspected) exposure to infections with a predominantly sexual mode of transmission: Secondary | ICD-10-CM | POA: Insufficient documentation

## 2021-05-13 LAB — URINALYSIS, ROUTINE W REFLEX MICROSCOPIC
Bilirubin Urine: NEGATIVE
Glucose, UA: NEGATIVE mg/dL
Hgb urine dipstick: NEGATIVE
Ketones, ur: NEGATIVE mg/dL
Leukocytes,Ua: NEGATIVE
Nitrite: NEGATIVE
Protein, ur: 30 mg/dL — AB
Specific Gravity, Urine: 1.014 (ref 1.005–1.030)
pH: 6 (ref 5.0–8.0)

## 2021-05-13 LAB — HIV ANTIBODY (ROUTINE TESTING W REFLEX): HIV Screen 4th Generation wRfx: REACTIVE — AB

## 2021-05-13 LAB — RPR: RPR Ser Ql: NONREACTIVE

## 2021-05-13 MED ORDER — METRONIDAZOLE 500 MG PO TABS
2000.0000 mg | ORAL_TABLET | Freq: Once | ORAL | Status: AC
  Administered 2021-05-13: 2000 mg via ORAL
  Filled 2021-05-13: qty 4

## 2021-05-13 MED ORDER — ONDANSETRON 4 MG PO TBDP
4.0000 mg | ORAL_TABLET | Freq: Once | ORAL | Status: AC
  Administered 2021-05-13: 4 mg via ORAL
  Filled 2021-05-13: qty 1

## 2021-05-13 NOTE — Discharge Instructions (Signed)
Today you were given a antibiotic called metronidazole/Flagyl.  It is very important that you do not drink any alcohol for the next 72 hours. ?If you do you have a risk of having a reaction where you get very ill. ? ?Please do not have any sexual contact for the next week. ?Please ensure that any sexual partners get treated or you can get reinfected. ? ?You have test for gonorrhea, chlamydia, HIV, and syphilis pending. ?You can follow these results through MyChart. ?These are positive you will need treatment. ?

## 2021-05-13 NOTE — ED Triage Notes (Signed)
Pt reports his girlfriend informed him she tested positive for trich ?Pt denies any penile dc or burning with urination  ?

## 2021-05-13 NOTE — ED Notes (Signed)
Discharge instructions reviewed and explained, pt verbalized understanding.  ?

## 2021-05-13 NOTE — ED Provider Notes (Signed)
?[facility] EMERGENCY DEPT ?Provider Note ? ? ?CSN: [id number] ?Arrival date & time: 05/13/21  0907 ? ?  ? ?History ? ?Chief Complaint  ?Patient presents with  ? Exposure to STD  ? ? ?Keith Blackwell is a 36 y.o. male who presents today after exposure to trichomonas ?He states that his partner informed him today that she tested positive.  He denies any symptoms including no penile or testicular pain.  No rectal pain or pain with bowel movements.  No genital sores or lesions.  No dysuria, frequency or urgency. ?He denies any fevers. ?Of note on chart review he was recently seen for diffuse rash and treated with prednisone.  This is improved. ? ?HPI ? ?  ? ?Home Medications ?Prior to Admission medications   ?Medication Sig Start Date End Date Taking? Authorizing Provider  ?benzonatate (TESSALON) 100 MG capsule Take 1 capsule (100 mg total) by mouth every 8 (eight) hours. 10/31/15   Bethel Born, PA-C  ?cetirizine (ZYRTEC) 10 MG tablet Take 10 mg by mouth daily.    [provider]  ?clotrimazole (LOTRIMIN) 1 % cream Apply to affected area 2 times daily 01/22/16   Liberty Handy, PA-C  ?diclofenac sodium (VOLTAREN) 1 % GEL Apply 4 g topically 4 (four) times daily. 07/31/16   Trixie Dredge, PA-C  ?fluticasone (FLONASE) 50 MCG/ACT nasal spray Place 1 spray into both nostrils daily. 10/31/15   Bethel Born, PA-C  ?hydrocortisone 2.5 % lotion Apply topically 2 (two) times daily. 10/04/20   Muthersbaugh, Dahlia Client, PA-C  ?hydrOXYzine (ATARAX) 25 MG tablet Take 1 tablet (25 mg total) by mouth every 6 (six) hours as needed for itching. 05/02/21   Gustavus Bryant, FNP  ?ibuprofen (ADVIL,MOTRIN) 800 MG tablet Take 1 tablet (800 mg total) by mouth every 8 (eight) hours as needed. 03/29/16   Lawyer, Cristal Deer, PA-C  ?meloxicam (MOBIC) 15 MG tablet Take 1 tablet (15 mg total) by mouth daily. Take 1 daily with food. 08/31/17   Arthor Captain, PA-C  ?methocarbamol (ROBAXIN) 500 MG tablet Take 1-2 tablets  (500-1,000 mg total) by mouth every 8 (eight) hours as needed (pain). 07/31/16   Trixie Dredge, PA-C  ?naproxen (NAPROSYN) 500 MG tablet Take 1 tablet (500 mg total) by mouth 2 (two) times daily. 02/25/21   Mannie Stabile, PA-C  ?ondansetron (ZOFRAN-ODT) 4 MG disintegrating tablet Take 1 tablet (4 mg total) by mouth every 8 (eight) hours as needed for nausea or vomiting. 02/25/21   Mannie Stabile, PA-C  ?oseltamivir (TAMIFLU) 75 MG capsule Take 1 capsule (75 mg total) by mouth every 12 (twelve) hours. 12/25/20   Tomi Bamberger, PA-C  ?predniSONE (STERAPRED UNI-PAK 21 TAB) 10 MG (21) TBPK tablet Take by mouth daily. Take 6 tabs by mouth daily  for 2 days, then 5 tabs for 2 days, then 4 tabs for 2 days, then 3 tabs for 2 days, 2 tabs for 2 days, then 1 tab by mouth daily for 2 days 05/02/21   Gustavus Bryant, FNP  ?terbinafine (LAMISIL AT) 1 % cream Apply 1 application topically 2 (two) times daily. ?Patient not taking: Reported on 01/05/2015 12/05/14   Arthor Captain, PA-C  ?   ? ?Allergies    ?Patient has no known allergies.   ? ?Review of Systems   ?Review of Systems ? ?Physical Exam ?Updated Vital Signs ?BP (!) 156/97 (BP Location: Left Arm)   Pulse (!) 113   Temp 98.5 ?F (36.9 ?C) (Oral)  Resp 18   SpO2 100%  ?Physical Exam ?Vitals and nursing note reviewed.  ?Constitutional:   ?   General: He is not in acute distress. ?HENT:  ?   Head: Normocephalic and atraumatic.  ?Cardiovascular:  ?   Rate and Rhythm: Normal rate.  ?Pulmonary:  ?   Effort: Pulmonary effort is normal. No respiratory distress.  ?Musculoskeletal:  ?   Cervical back: No rigidity.  ?Neurological:  ?   Mental Status: He is alert. Mental status is at baseline.  ?   Comments: Awake and alert, answers all questions appropriately.  Speech is not slurred.    ?Psychiatric:     ?   Mood and Affect: Mood normal.  ? ? ?ED Results / Procedures / Treatments   ?Labs ?(all labs ordered are listed, but only abnormal results are displayed) ?Labs Reviewed   ?URINALYSIS, ROUTINE W REFLEX MICROSCOPIC - Abnormal; Notable for the following components:  ?    Result Value  ? Color, Urine COLORLESS (*)   ? Protein, ur 30 (*)   ? All other components within normal limits  ?RPR  ?HIV ANTIBODY (ROUTINE TESTING W REFLEX)  ?GC/CHLAMYDIA PROBE AMP ([place]) NOT AT [facility]  ? ? ?EKG ?None ? ?Radiology ?No results found. ? ?Procedures ?Procedures  ? ? ?Medications Ordered in ED ?Medications  ?ondansetron (ZOFRAN-ODT) disintegrating tablet 4 mg (has no administration in time range)  ?metroNIDAZOLE (FLAGYL) tablet 2,000 mg (has no administration in time range)  ? ? ?ED Course/ Medical Decision Making/ A&P ?  ?                        ?Medical Decision Making ?Patient is a healthy 36 year old man who presents today after he was informed that he had a exposure to trichomoniasis. ?He denies any symptoms, genital lesions, sores. ? ?UA here has 30 protein however is otherwise unremarkable.  Microscopy was not performed, however we will treat patient for trichomoniasis given known exposure. ? ?Patient also was seen for a body wide rash.   ?In the context of possible STI exposure we will also test here for HIV and syphilis in addition to gonorrhea or chlamydia. ? ?Given that he is not having any symptoms we discussed empiric treatment versus awaiting GC testing results in order shared decision making we will await GC testing results. ? ?Patient's history states he has not had any alcohol in the past 72 hours, advised to abstain from alcohol for the next 72 hours. ? ? ? ?Amount and/or Complexity of Data Reviewed ?External Data Reviewed: labs and notes. ?Labs: ordered. ? ?Risk ?Prescription drug management. ?Decision regarding hospitalization. ? ? ?Return precautions were discussed with patient who states their understanding.  At the time of discharge patient denied any unaddressed complaints or concerns.  Patient is agreeable for discharge home. ? ?Note: Portions of this report may have been  transcribed using voice recognition software. Every effort was made to ensure accuracy; however, inadvertent computerized transcription errors may be present ? ? ? ? ? ? ? ? ?Final Clinical Impression(s) / ED Diagnoses ?Final diagnoses:  ?Exposure to trichomonas  ? ? ?Rx / DC Orders ?ED Discharge Orders   ? ? None  ? ?  ? ? ?  ?Cristina Gong, [place] ?05/13/21 1759 ? ?  ?Sloan Leiter, DO ?05/14/21 1852 ? ?

## 2021-05-14 LAB — GC/CHLAMYDIA PROBE AMP (~~LOC~~) NOT AT ARMC
Chlamydia: NEGATIVE
Comment: NEGATIVE
Comment: NORMAL
Neisseria Gonorrhea: NEGATIVE

## 2021-05-15 ENCOUNTER — Telehealth: Payer: Self-pay | Admitting: Infectious Disease

## 2021-05-15 LAB — HIV-1/2 AB - DIFFERENTIATION
HIV 1 Ab: REACTIVE
HIV 2 Ab: NONREACTIVE

## 2021-05-15 NOTE — Telephone Encounter (Signed)
Referral faxed to [facility] to assist with notification of results and linkage to care. ? ?Sandie Ano, RN ? ?

## 2021-05-15 NOTE — Telephone Encounter (Signed)
True positive HIV 1 confirm assay ? ?Need to get him to clinic.  ? ?He knew he was being tested for HIV but do not think he knows result ?

## 2021-05-29 ENCOUNTER — Telehealth: Payer: Self-pay

## 2021-05-29 ENCOUNTER — Other Ambulatory Visit (HOSPITAL_COMMUNITY): Payer: Self-pay

## 2021-05-29 NOTE — Telephone Encounter (Signed)
[facility] Patient Advocate Encounter ? ?Insurance verification completed.   ? ?The patient is uninsured and will need patient assistance for medication. ? ?We can complete the application and will need to meet with the patient for signatures and income documentation. ? ?Lyana Asbill, CPhT ?Specialty Pharmacy Patient Advocate ?[facility] ?Phone: [phone] ?Fax:  [phone]  ?

## 2021-05-30 ENCOUNTER — Encounter: Payer: Self-pay | Admitting: Internal Medicine

## 2021-05-30 ENCOUNTER — Ambulatory Visit: Payer: Self-pay

## 2021-05-30 ENCOUNTER — Ambulatory Visit (INDEPENDENT_AMBULATORY_CARE_PROVIDER_SITE_OTHER): Payer: Self-pay | Admitting: Internal Medicine

## 2021-05-30 ENCOUNTER — Other Ambulatory Visit (HOSPITAL_COMMUNITY): Payer: Self-pay

## 2021-05-30 ENCOUNTER — Ambulatory Visit: Payer: Self-pay | Admitting: Pharmacist

## 2021-05-30 ENCOUNTER — Other Ambulatory Visit: Payer: Self-pay

## 2021-05-30 VITALS — BP 144/97 | HR 99 | Temp 97.3°F | Ht 70.0 in | Wt 247.0 lb

## 2021-05-30 DIAGNOSIS — B20 Human immunodeficiency virus [HIV] disease: Secondary | ICD-10-CM

## 2021-05-30 DIAGNOSIS — Z113 Encounter for screening for infections with a predominantly sexual mode of transmission: Secondary | ICD-10-CM

## 2021-05-30 DIAGNOSIS — R21 Rash and other nonspecific skin eruption: Secondary | ICD-10-CM

## 2021-05-30 MED ORDER — CLOBETASOL PROPIONATE 0.05 % EX CREA: 1.0000 "application " | TOPICAL_CREAM | Freq: Two times a day (BID) | CUTANEOUS | 3 refills | Status: DC

## 2021-05-30 MED ORDER — CLOBETASOL PROPIONATE 0.05 % EX CREA: 1.0000 "application " | TOPICAL_CREAM | Freq: Two times a day (BID) | CUTANEOUS | 0 refills | Status: DC

## 2021-05-30 NOTE — Progress Notes (Addendum)
?  ? ? ? ? ?[facility] ? ?Reason for Consult:new HIV diagnosis ?Referring Provider: dhs ? ? ? ?There are no problems to display for this patient. ? ? ? ? ?HPI: Keith Blackwell is a 36 y.o. male who was just tested positive for hiv a few days ago.  ? ?He came to the ED for an itchy rash on 4/10; given steroid cream. Tested for rpr and hiv as well ? ?He was notified a day ago about the positive hiv test ?He has been with one male partner 3 years -- she is getting testing as of a day prior to this visit ? ?He reports he noticed the rash in September 2022 (first time) -- intensely itch plaque/papular-squamous on in extensors side of bilateral elbows, around the belly button, and also popliteal fossa of the right knee. His grandmother does have psoriasis. No genital lesion  ? ?No sign/sx of recent viral illness within the 3 months prior to the hiv test. His last hiv testing was 2019 and was negative. He also have never had positive rpr in the past (no hx syphilis told) ? ?Patient been feeling well otherwise without any abnormal sign/symptoms of concern outside of the rash (denies fever, chill, nightsweat, weightloss, decreased appetite, cough, chest pain, abd pain, n/v/diarrhea, penile discharge, lymph node swelling, headache, numbness/tingling, visual changes) ? ?Social: ?-moved to [place], but grew up in [place] (born in [place]) ?-lives with grandmother ?-GED education level ?-not working right now ?-In a relationship of 3 years -- heterosexual relationship ?-marijuanna smoking; tobacco since age 63, currently about a ppd; no ivdu or other substance ? ?We talked about the new merk study vs biktarvy -- he is interested and [place] with the research group will talk to him more this visit ? ?He has never been on any hiv drug including PrEP ? ?Review of Systems: ?ROS ?All other ros negative ? ? ? ? ? ?Past Medical History:  ?Diagnosis Date  ? Environmental allergies   ?  Migraine   ? ? ?Social History  ? ?Tobacco Use  ? Smoking status: Every Day  ?  Packs/day: 1.00  ?  Types: Cigarettes  ? Smokeless tobacco: Never  ?Substance Use Topics  ? Alcohol use: No  ? Drug use: No  ? ? ?Fam hx: ?Grandmother with psoriasis ? ?No Known Allergies ? ?OBJECTIVE: ?Vitals:  ? 05/30/21 0905  ?BP: (!) 144/97  ?Pulse: 99  ?Temp: (!) 97.3 ?F (36.3 ?C)  ?TempSrc: Oral  ?SpO2: 99%  ?Weight: 247 lb (112 kg)  ?Height: 5\' 10"  (1.778 m)  ? ?Body mass index is 35.44 kg/m?. ? ? ?Physical Exam ?General/constitutional: no distress, pleasant ?HEENT: Normocephalic, PER, Conj Clear, EOMI, Oropharynx clear ?Neck supple ?CV: rrr no mrg ?Lungs: clear to auscultation, normal respiratory effort ?Abd: Soft, Nontender ?Ext: no edema ?Skin: see picture ?Neuro: nonfocal ?MSK: no peripheral joint swelling/tenderness/warmth; back spines nontender ? ? ? ? ? ? ? ? ? ? ? ?Lab: ?Lab Results  ?Component Value Date  ? WBC 3.2 (L) 02/25/2021  ? HGB 12.0 (L) 02/25/2021  ? HCT 39.6 02/25/2021  ? MCV 76.4 (L) 02/25/2021  ? PLT 144 (L) 02/25/2021  ? ?Last metabolic panel ?Lab Results  ?Component Value Date  ? GLUCOSE 80 02/25/2021  ? NA 139 02/25/2021  ? K 3.6 02/25/2021  ? CL 104 02/25/2021  ? CO2 27 02/25/2021  ? BUN 17 02/25/2021  ? CREATININE 1.19 02/25/2021  ? GFRNONAA >60 02/25/2021  ?  CALCIUM 9.5 02/25/2021  ? PROT 8.0 02/25/2021  ? ALBUMIN 4.2 02/25/2021  ? BILITOT 0.7 02/25/2021  ? ALKPHOS 48 02/25/2021  ? AST 22 02/25/2021  ? ALT 24 02/25/2021  ? ANIONGAP 8 02/25/2021  ? ? ?Microbiology: ? ?Serology: ? ?Imaging: ? ? ?Assessment/plan: ?Problem List Items Addressed This Visit   ?None ?Visit Diagnoses   ? ? HIV disease (HCC)    -  Primary  ? Rash      ? ?  ? ? ? ? 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 ?Spoke with Tresa Endo who will start the consent process/screening process ?Spoke with Deanna who will enroll him into [employer] program ? ? ? ?-discussed u=u ?-encourage  compliance ?-HIV meds per [facility] study  ?-labs with research group ?-f/u in 2 months ? ? ? ?#rash ?Ddx is psoriasis vs eczema ? ?-check fta ?-trial of steroid cream (clobetasol) ?-f/u 2 months ? ? ?#hcm ?-vaccinations ?-hepatitis screening ?Will do next few visits ?-std screening  ?Gc/chlam and syphilis negative 05/13/2021 ?Will check fta today ?-tb screening ?Will discuss next few visits ?-cancer screening ?N/a ? ? ?---------- ?Addendum ?He called back 4/28 and stated that he no longer wants to participate in the research study ? ?I will notify our research team ? ?I will also start him on biktarvy and get basic hiv labs ? ?Notified patient via phone ? ?Follow up 6-8 weeks from today ? ?Will provide 4 weeks biktarvy sample while he is awaiting his ryan white approval ? ? ? ?Follow-up: Return in about 2 months (around 07/30/2021). ? ?Raymondo Band, MD ?[facility] for Infectious Disease ?[facility] ?[phone] pager   [phone] cell ?05/30/2021, 9:15 AM ? ?

## 2021-05-30 NOTE — Patient Instructions (Signed)
Labs today for syphilis ? ? ?See me in 2 months ? ? ?Thank you for considering the research study ? ? ? ?

## 2021-05-31 ENCOUNTER — Other Ambulatory Visit: Payer: Self-pay

## 2021-05-31 ENCOUNTER — Other Ambulatory Visit: Payer: Self-pay | Admitting: Pharmacist

## 2021-05-31 ENCOUNTER — Other Ambulatory Visit (HOSPITAL_COMMUNITY): Payer: Self-pay

## 2021-05-31 ENCOUNTER — Telehealth: Payer: Self-pay

## 2021-05-31 DIAGNOSIS — B20 Human immunodeficiency virus [HIV] disease: Secondary | ICD-10-CM

## 2021-05-31 DIAGNOSIS — L299 Pruritus, unspecified: Secondary | ICD-10-CM

## 2021-05-31 DIAGNOSIS — Z113 Encounter for screening for infections with a predominantly sexual mode of transmission: Secondary | ICD-10-CM

## 2021-05-31 MED ORDER — BICTEGRAVIR-EMTRICITAB-TENOFOV 50-200-25 MG PO TABS: 1.0000 | ORAL_TABLET | Freq: Every day | ORAL | 11 refills | Status: DC

## 2021-05-31 MED ORDER — BIKTARVY 50-200-25 MG PO TABS: 1.0000 | ORAL_TABLET | Freq: Every day | ORAL | 0 refills | Status: AC

## 2021-05-31 NOTE — Telephone Encounter (Addendum)
Patient in clinic for labs with additional questions for RN. He expresses that he is not currently interested in research study due to the overwhelm he feels from his recent diagnosis.  ? ?He has questions about how it's possible for him to be diagnosed with HIV, but his girlfriend of three years recently tested negative. Discussed transmission factors and u=u. Provided patient with non-latex condoms as his partner has an allergy.  ? ?Discussed relationship between viral load and CD4 count. Patient is interested in starting medication today. Dr. Renold Don counseled patient on medication and he was provided with samples [facility]).  ? ?Asked patient to please reach out to the office if he has any concerns or questions between now and his next appointment.  ? ?Sandie Ano, RN ? ?

## 2021-05-31 NOTE — Addendum Note (Signed)
Addended byRutha Bouchard T on: 05/31/2021 12:13 PM ? ? Modules accepted: Orders ? ?

## 2021-05-31 NOTE — Addendum Note (Signed)
Addended byRutha Bouchard T on: 05/31/2021 09:15 AM ? ? Modules accepted: Orders ? ?

## 2021-05-31 NOTE — Progress Notes (Signed)
Medication Samples have been provided to the patient. ? ?Drug name: Biktarvy        ?Strength: 50/200/25 mg       ?Qty: 28 tablets (4 bottles)   ?LOT: [id number]   ?Exp.Date: 10/2023 ? ?Dosing instructions: Take one tablet by mouth once daily ? ?The patient has been instructed regarding the correct time, dose, and frequency of taking this medication, including desired effects and most common side effects.  ? ?Edrik Rundle L. Quamere Mussell, PharmD, BCIDP, AAHIVP, CPP ?Clinical Pharmacist Practitioner ?Infectious Diseases Clinical Pharmacist ?[facility] ?01/16/2020, 10:07 AM ? ?

## 2021-06-04 NOTE — Telephone Encounter (Signed)
Called patient to discuss results, no answer. Left HIPAA compliant voicemail. ? ?Sandie Ano, RN ? ? ? ?

## 2021-06-05 MED ORDER — HYDROXYZINE PAMOATE 25 MG PO CAPS: 25.0000 mg | ORAL_CAPSULE | Freq: Three times a day (TID) | ORAL | 0 refills | Status: DC | PRN

## 2021-06-05 NOTE — Addendum Note (Signed)
Addended by: Linna Hoff D on: 06/05/2021 04:26 PM ? ? Modules accepted: Orders ? ?

## 2021-06-05 NOTE — Addendum Note (Signed)
Addended byRutha Bouchard T on: 06/05/2021 04:02 PM ? ? Modules accepted: Orders ? ?

## 2021-06-05 NOTE — Telephone Encounter (Signed)
Canceled hydroxyzine at [facility] and resent to [facility] on [address] as patient will have ADAP. ? ?Sandie Ano, RN ? ?

## 2021-06-15 LAB — CBC WITH DIFFERENTIAL/PLATELET
Absolute Monocytes: 390 cells/uL (ref 200–950)
Basophils Absolute: 9 cells/uL (ref 0–200)
Basophils Relative: 0.3 %
Eosinophils Absolute: 51 cells/uL (ref 15–500)
Eosinophils Relative: 1.7 %
HCT: 43 % (ref 38.5–50.0)
Hemoglobin: 12.9 g/dL — ABNORMAL LOW (ref 13.2–17.1)
Lymphs Abs: 852 cells/uL (ref 850–3900)
MCH: 23.6 pg — ABNORMAL LOW (ref 27.0–33.0)
MCHC: 30 g/dL — ABNORMAL LOW (ref 32.0–36.0)
MCV: 78.6 fL — ABNORMAL LOW (ref 80.0–100.0)
MPV: 12.8 fL — ABNORMAL HIGH (ref 7.5–12.5)
Monocytes Relative: 13 %
Neutro Abs: 1698 cells/uL (ref 1500–7800)
Neutrophils Relative %: 56.6 %
Platelets: 174 10*3/uL (ref 140–400)
RBC: 5.47 10*6/uL (ref 4.20–5.80)
RDW: 15.2 % — ABNORMAL HIGH (ref 11.0–15.0)
Total Lymphocyte: 28.4 %
WBC: 3 10*3/uL — ABNORMAL LOW (ref 3.8–10.8)

## 2021-06-15 LAB — COMPLETE METABOLIC PANEL WITH GFR
AG Ratio: 1.1 (calc) (ref 1.0–2.5)
ALT: 25 U/L (ref 9–46)
AST: 19 U/L (ref 10–40)
Albumin: 4.1 g/dL (ref 3.6–5.1)
Alkaline phosphatase (APISO): 55 U/L (ref 36–130)
BUN: 13 mg/dL (ref 7–25)
CO2: 28 mmol/L (ref 20–32)
Calcium: 9 mg/dL (ref 8.6–10.3)
Chloride: 104 mmol/L (ref 98–110)
Creat: 1.02 mg/dL (ref 0.60–1.26)
Globulin: 3.6 g/dL (calc) (ref 1.9–3.7)
Glucose, Bld: 88 mg/dL (ref 65–99)
Potassium: 3.9 mmol/L (ref 3.5–5.3)
Sodium: 139 mmol/L (ref 135–146)
Total Bilirubin: 0.6 mg/dL (ref 0.2–1.2)
Total Protein: 7.7 g/dL (ref 6.1–8.1)
eGFR: 98 mL/min/{1.73_m2} (ref >=60)

## 2021-06-15 LAB — HIV-1 INTEGRASE GENOTYPE

## 2021-06-15 LAB — RPR: RPR Ser Ql: NONREACTIVE

## 2021-06-15 LAB — HEPATITIS C ANTIBODY
Hepatitis C Ab: NONREACTIVE
SIGNAL TO CUT-OFF: 0.15 (ref <1.00)

## 2021-06-15 LAB — HIV RNA, RTPCR W/R GT (RTI, PI,INT)
HIV 1 RNA Quant: 111000 copies/mL — ABNORMAL HIGH
HIV-1 RNA Quant, Log: 5.05 Log copies/mL — ABNORMAL HIGH

## 2021-06-15 LAB — HEPATITIS A ANTIBODY, TOTAL: Hepatitis A AB,Total: NONREACTIVE

## 2021-06-15 LAB — HEPATITIS B SURFACE ANTIBODY, QUANTITATIVE: Hep B S AB Quant (Post): 26 m[IU]/mL (ref >=10)

## 2021-06-15 LAB — T-HELPER CELLS (CD4) COUNT (NOT AT ARMC)
Absolute CD4: 50 cells/uL — ABNORMAL LOW (ref 490–1740)
CD4 T Helper %: 5 % — ABNORMAL LOW (ref 30–61)
Total lymphocyte count: 934 cells/uL (ref 850–3900)

## 2021-06-15 LAB — HEPATITIS B SURFACE ANTIGEN: Hepatitis B Surface Ag: NONREACTIVE

## 2021-06-15 LAB — HIV-1 RNA QUANT-NO REFLEX-BLD
HIV 1 RNA Quant: 129000 copies/mL — ABNORMAL HIGH
HIV-1 RNA Quant, Log: 5.11 Log copies/mL — ABNORMAL HIGH

## 2021-06-15 LAB — HIV-1 GENOTYPE: HIV-1 Genotype: DETECTED — AB

## 2021-06-15 LAB — HEPATITIS B CORE ANTIBODY, TOTAL: Hep B Core Total Ab: NONREACTIVE

## 2021-07-08 ENCOUNTER — Other Ambulatory Visit: Payer: Self-pay

## 2021-07-08 ENCOUNTER — Ambulatory Visit (INDEPENDENT_AMBULATORY_CARE_PROVIDER_SITE_OTHER): Payer: Self-pay | Admitting: Internal Medicine

## 2021-07-08 ENCOUNTER — Encounter: Payer: Self-pay | Admitting: Internal Medicine

## 2021-07-08 VITALS — BP 146/94 | HR 105 | Temp 98.2°F | Ht 70.0 in | Wt 253.0 lb

## 2021-07-08 DIAGNOSIS — Z113 Encounter for screening for infections with a predominantly sexual mode of transmission: Secondary | ICD-10-CM

## 2021-07-08 DIAGNOSIS — B20 Human immunodeficiency virus [HIV] disease: Secondary | ICD-10-CM

## 2021-07-08 DIAGNOSIS — R21 Rash and other nonspecific skin eruption: Secondary | ICD-10-CM

## 2021-07-08 DIAGNOSIS — G4700 Insomnia, unspecified: Secondary | ICD-10-CM

## 2021-07-08 MED ORDER — MELATONIN 5 MG PO CAPS: 5.0000 mg | ORAL_CAPSULE | Freq: Every evening | ORAL | 1 refills | Status: AC

## 2021-07-08 MED ORDER — ZOLPIDEM TARTRATE 10 MG PO TABS: 10.0000 mg | ORAL_TABLET | Freq: Every evening | ORAL | 0 refills | Status: DC | PRN

## 2021-07-08 MED ORDER — BICTEGRAVIR-EMTRICITAB-TENOFOV 50-200-25 MG PO TABS: 1.0000 | ORAL_TABLET | Freq: Every day | ORAL | 11 refills | Status: DC

## 2021-07-08 NOTE — Progress Notes (Signed)
Met with patient today to discuss getting medications filled at [facility] since he has [employer]. He asked to get it mailed and they told him to download the app first. I went through the app and tried to figure it out but it is not very user friendly. He will stop by the store on [facility] [address] today to pick up his prescription this month. I will send his remaining script to the [facility] in [place] for mail order. I gave him their number and told him to call in 2-3 weeks to set up his shipment if he has not heard from them. He will reach out to me through MyChart if he has any further issues.   Masayo Fera L. Anjolina Byrer, PharmD, BCIDP, AAHIVP, CPP Clinical Pharmacist Practitioner Infectious Diseases [place] [facility] 07/08/2021, 9:35 AM

## 2021-07-08 NOTE — Progress Notes (Signed)
Rx faxed to [facility].

## 2021-07-08 NOTE — Patient Instructions (Addendum)
You are experiencing adjustment to this diagnosis. Rest assure we'll get you better (undetectable) within another 1-2 months. By then I suspect your mind will start feeling better  If you still have depression symptoms by then will be better off seeing psychiatry for counseling plus meds  Your girlfriend tests negative. I suspect she might be one of those few people with a mutation that is resistant to getting hiv   Continue your biktarvy.   Tests today and in 1 month...   Sleep aide Try melatonin before bedtime for a week. If that doesn't work then try ambien (5 mg first, then 10 mg) every night as needed  Primary Care:  [facility] [address] Call [phone] to make an appointment.

## 2021-07-08 NOTE — Progress Notes (Signed)
[facility]    There are no problems to display for this patient.  Cc- hiv tx follow up   HPI: Keith Blackwell is a 36 y.o. male who was just tested positive for hiv a few days ago.   He came to the ED for an itchy rash on 4/10; given steroid cream. Tested for rpr and hiv as well  He was notified a day ago about the positive hiv test He has been with one male partner 3 years -- she is getting testing as of a day prior to this visit  He reports he noticed the rash in September 2022 (first time) -- intensely itch plaque/papular-squamous on in extensors side of bilateral elbows, around the belly button, and also popliteal fossa of the right knee. His grandmother does have psoriasis. No genital lesion   No sign/sx of recent viral illness within the 3 months prior to the hiv test. His last hiv testing was 2019 and was negative. He also have never had positive rpr in the past (no hx syphilis told)  Patient been feeling well otherwise without any abnormal sign/symptoms of concern outside of the rash (denies fever, chill, nightsweat, weightloss, decreased appetite, cough, chest pain, abd pain, n/v/diarrhea, penile discharge, lymph node swelling, headache, numbness/tingling, visual changes)  Social: -moved to [place] 2002, but grew up in [place] (born in [place]) -lives with grandmother -GED education level -not working right now -In a relationship of 3 years -- heterosexual relationship -marijuanna smoking; tobacco since age 56, currently about a ppd; no ivdu or other substance  We talked about the new merk study vs biktarvy -- he is interested and [name] with the research group will talk to him more this visit  He has never been on any hiv drug including PrEP  -------------------- 07/08/21 id f/u    His girlfriend tested 3-4 times ([facility]) and was negative. 3 years monogamous relationship   His last negative  test was 08/2017 right before he knew his girlfriend  Takes biktarvy daily without missed dose  Feels depressed asked about antidepression. No SI/HI. Sleeping is terrible with regard to falling asleep since the diagnosis...  No other complaint   The rash he had previously is gone with topical steroid    Review of Systems: ROS All other ros negative      Past Medical History:  Diagnosis Date   Environmental allergies    Migraine     Social History   Tobacco Use   Smoking status: Every Day    Packs/day: 1.00    Types: Cigarettes   Smokeless tobacco: Never  Substance Use Topics   Alcohol use: Yes    Comment: occasionally   Drug use: Yes    Frequency: 7.0 times per week    Types: Marijuana    Comment: [place] daily    Fam hx: Grandmother with psoriasis  No Known Allergies  OBJECTIVE: Vitals:   07/08/21 0829  BP: (!) 146/94  Pulse: (!) 105  Temp: 98.2 F (36.8 C)  TempSrc: Oral  SpO2: 98%  Weight: 253 lb (114.8 kg)  Height: 5\' 10"  (1.778 m)   Body mass index is 36.3 kg/m.   Physical Exam General/constitutional: no distress, pleasant HEENT: Normocephalic, PER, Conj Clear, EOMI, Oropharynx clear Neck supple CV: rrr no mrg Lungs: clear to auscultation, normal respiratory effort Abd: Soft, Nontender Ext: no edema Skin: previous erythematous plaque rash resolved now with hyperpigmentation Neuro:  nonfocal MSK: no peripheral joint swelling/tenderness/warmth; back spines nontender           Lab: Lab Results  Component Value Date   WBC 3.0 (L) 05/31/2021   HGB 12.9 (L) 05/31/2021   HCT 43.0 05/31/2021   MCV 78.6 (L) 05/31/2021   PLT 174 05/31/2021   Last metabolic panel Lab Results  Component Value Date   GLUCOSE 88 05/31/2021   NA 139 05/31/2021   K 3.9 05/31/2021   CL 104 05/31/2021   CO2 28 05/31/2021   BUN 13 05/31/2021   CREATININE 1.02 05/31/2021   GFRNONAA >60 02/25/2021   CALCIUM 9.0 05/31/2021   PROT 7.7 05/31/2021    ALBUMIN 4.2 02/25/2021   BILITOT 0.6 05/31/2021   ALKPHOS 48 02/25/2021   AST 19 05/31/2021   ALT 25 05/31/2021   ANIONGAP 8 02/25/2021    Microbiology:  Serology:  Imaging:   Assessment/plan: Problem List Items Addressed This Visit   None Visit Diagnoses     HIV disease (HCC)    -  Primary   Relevant Orders   CBC   Comprehensive metabolic panel   HIV-1 RNA quant-no reflex-bld   T-helper cells (CD4) count   Rash       Relevant Orders   Fluorescent treponemal ab(fta)-IgG-bld   Screening for STDs (sexually transmitted diseases)       Relevant Orders   Fluorescent treponemal ab(fta)-IgG-bld        #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 Explain somebody with receptor mutation might be more resistant in acquiring hiv. Explains that he might have gotten it in 08/2017 but didn't seroconvert yet (was just a hiv antibody test)  Explains to him he'll be almost living a normal life as long as he takes his biktarvy. He has been 100% compliant     -discussed u=u -encourage compliance -continue current HIV medication -labs today -f/u 4-6 weeeks    #rash Ddx is psoriasis vs eczema Resolved with steroid cream clobetasol as of 07/08/21 visit.   -send fta now; not sure why it wasn't done last time -continue steroid cream as needed   #adjustment disorder #insomnia Counsel patient on timing/dx of depression and allowing time for his body to adjust He is having depressed mood and falling asleep issue but no full blown depression  -f/u as needed: counselor referral if no resolution of sx in 2-3 months -prn melatonin and ambien (melatonin first for a week)   #hcm -vaccinations -hepatitis screening Will do next few visits -std screening  Gc/chlam and syphilis negative 05/13/2021 Will check fta today -tb screening Will discuss next few  visits -cancer screening N/a   I have spent a total of 40 minutes of face-to-face and non-face-to-face time, excluding clinical staff time, preparing to see patient, ordering tests and/or medications, and provide counseling the patient   Follow-up: Return in about 4 weeks (around 08/05/2021).  Keith Band, MD [facility] for Infectious Disease [facility] [phone] pager   [phone] cell 07/08/2021, 8:52 AM

## 2021-07-09 LAB — T-HELPER CELLS (CD4) COUNT (NOT AT ARMC)
CD4 % Helper T Cell: 8 % — ABNORMAL LOW (ref 33–65)
CD4 T Cell Abs: 119 /uL — ABNORMAL LOW (ref 400–1790)

## 2021-07-09 NOTE — Telephone Encounter (Signed)
Responded to patient in new thread.   Sandie Ano, RN

## 2021-07-10 ENCOUNTER — Encounter: Payer: Self-pay | Admitting: Internal Medicine

## 2021-07-10 ENCOUNTER — Telehealth: Payer: Self-pay

## 2021-07-10 NOTE — Telephone Encounter (Signed)
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 Tomorrow is good   Thanks

## 2021-07-10 NOTE — Telephone Encounter (Signed)
Responded to patient in separate encounter.   Sandie Ano, RN

## 2021-07-10 NOTE — Telephone Encounter (Signed)
Patient advised of syphilis results. Patient schedule for bicillin 2.4 MU x 3. Patient verbalized understanding. Cristine Daw T Pricilla Loveless

## 2021-07-11 ENCOUNTER — Ambulatory Visit (INDEPENDENT_AMBULATORY_CARE_PROVIDER_SITE_OTHER): Payer: Self-pay

## 2021-07-11 ENCOUNTER — Other Ambulatory Visit: Payer: Self-pay

## 2021-07-11 DIAGNOSIS — A539 Syphilis, unspecified: Secondary | ICD-10-CM

## 2021-07-11 MED ORDER — PENICILLIN G BENZATHINE 1200000 UNIT/2ML IM SUSY
1.2000 10*6.[IU] | PREFILLED_SYRINGE | Freq: Once | INTRAMUSCULAR | Status: AC
  Administered 2021-07-11: 1.2 10*6.[IU] via INTRAMUSCULAR

## 2021-07-11 NOTE — Progress Notes (Signed)
Reviewed and verified allergies with patient. Patient tolerated Bicillin injections well.   He states his girlfriend has been tested for RPR multiple times and has remained non-reactive.   Talked with him about interpretation of results and that treponemal antibody will remain reactive for life, but that this does not indicate active infection - just a memory of prior infection.   Also discussed that it could be a false positive, but that we are treating him just to be extra cautious. Patient verbalized understanding and has no further questions.    Beryle Flock, RN

## 2021-07-11 NOTE — Telephone Encounter (Signed)
Spoke with Robin at [facility], they have no RPR history on the patient.   Sandie Ano, RN

## 2021-07-11 NOTE — Telephone Encounter (Signed)
I explained to him, but i can do again  Grab me   Basically rpr burns out over time even without treatment (nonspecific not syphilis  epitope related antibody that react with host damaged cell's components).... So as the damages resolves over time the antibody responses goes down  Whereas the specific syphilis antibody like tppa, fta is remembered by the host immune system and stays positive forever, like hepati tis antibody   A good reference if you have time: https://www.ncbi.nlm.nih.gov/pmc/articles/PMC8451404/#:~:text=Currently%2C%20cases%20of%20possible%20syphilis,used%20to%20assess%20disease%20activity

## 2021-07-16 ENCOUNTER — Telehealth: Payer: Self-pay

## 2021-07-16 LAB — COMPREHENSIVE METABOLIC PANEL
AG Ratio: 1.2 (calc) (ref 1.0–2.5)
ALT: 18 U/L (ref 9–46)
AST: 18 U/L (ref 10–40)
Albumin: 4.2 g/dL (ref 3.6–5.1)
Alkaline phosphatase (APISO): 72 U/L (ref 36–130)
BUN/Creatinine Ratio: 11 (calc) (ref 6–22)
BUN: 15 mg/dL (ref 7–25)
CO2: 28 mmol/L (ref 20–32)
Calcium: 9.3 mg/dL (ref 8.6–10.3)
Chloride: 105 mmol/L (ref 98–110)
Creat: 1.32 mg/dL — ABNORMAL HIGH (ref 0.60–1.26)
Globulin: 3.6 g/dL (calc) (ref 1.9–3.7)
Glucose, Bld: 85 mg/dL (ref 65–99)
Potassium: 4.4 mmol/L (ref 3.5–5.3)
Sodium: 139 mmol/L (ref 135–146)
Total Bilirubin: 0.4 mg/dL (ref 0.2–1.2)
Total Protein: 7.8 g/dL (ref 6.1–8.1)

## 2021-07-16 LAB — TEST AUTHORIZATION

## 2021-07-16 LAB — HIV-1 RNA QUANT-NO REFLEX-BLD
HIV 1 RNA Quant: 69 Copies/mL — ABNORMAL HIGH
HIV-1 RNA Quant, Log: 1.84 Log cps/mL — ABNORMAL HIGH

## 2021-07-16 LAB — CBC
HCT: 42.6 % (ref 38.5–50.0)
Hemoglobin: 12.8 g/dL — ABNORMAL LOW (ref 13.2–17.1)
MCH: 23.7 pg — ABNORMAL LOW (ref 27.0–33.0)
MCHC: 30 g/dL — ABNORMAL LOW (ref 32.0–36.0)
MCV: 79 fL — ABNORMAL LOW (ref 80.0–100.0)
MPV: 10.5 fL (ref 7.5–12.5)
Platelets: 200 10*3/uL (ref 140–400)
RBC: 5.39 10*6/uL (ref 4.20–5.80)
RDW: 14.7 % (ref 11.0–15.0)
WBC: 4 10*3/uL (ref 3.8–10.8)

## 2021-07-16 LAB — FLUORESCENT TREPONEMAL AB(FTA)-IGG-BLD: Fluorescent Treponemal ABS: REACTIVE — AB

## 2021-07-16 LAB — RPR: RPR Ser Ql: NONREACTIVE

## 2021-07-16 NOTE — Telephone Encounter (Signed)
Called patient to congratulate him on reduction in viral load, no answer. Left generic voicemail stating that provider sent a MyChart message and to please call with any questions.   Sandie Ano, RN

## 2021-07-18 ENCOUNTER — Other Ambulatory Visit: Payer: Self-pay

## 2021-07-18 ENCOUNTER — Ambulatory Visit (INDEPENDENT_AMBULATORY_CARE_PROVIDER_SITE_OTHER): Payer: Self-pay

## 2021-07-18 DIAGNOSIS — A539 Syphilis, unspecified: Secondary | ICD-10-CM

## 2021-07-18 MED ORDER — PENICILLIN G BENZATHINE 1200000 UNIT/2ML IM SUSY
1.2000 10*6.[IU] | PREFILLED_SYRINGE | Freq: Once | INTRAMUSCULAR | Status: AC
  Administered 2021-07-18: 1.2 10*6.[IU] via INTRAMUSCULAR

## 2021-07-25 ENCOUNTER — Other Ambulatory Visit: Payer: Self-pay

## 2021-07-25 ENCOUNTER — Ambulatory Visit (INDEPENDENT_AMBULATORY_CARE_PROVIDER_SITE_OTHER): Payer: Self-pay

## 2021-07-25 DIAGNOSIS — A539 Syphilis, unspecified: Secondary | ICD-10-CM

## 2021-07-25 MED ORDER — PENICILLIN G BENZATHINE 1200000 UNIT/2ML IM SUSY
1.2000 10*6.[IU] | PREFILLED_SYRINGE | Freq: Once | INTRAMUSCULAR | Status: AC
  Administered 2021-07-25: 1.2 10*6.[IU] via INTRAMUSCULAR

## 2021-07-31 ENCOUNTER — Ambulatory Visit: Payer: Self-pay

## 2021-08-13 ENCOUNTER — Encounter: Payer: Self-pay | Admitting: Internal Medicine

## 2021-08-13 ENCOUNTER — Ambulatory Visit (INDEPENDENT_AMBULATORY_CARE_PROVIDER_SITE_OTHER): Payer: Self-pay | Admitting: Internal Medicine

## 2021-08-13 ENCOUNTER — Ambulatory Visit: Payer: Self-pay

## 2021-08-13 ENCOUNTER — Other Ambulatory Visit: Payer: Self-pay

## 2021-08-13 VITALS — BP 146/103 | HR 88 | Temp 98.3°F | Wt 258.0 lb

## 2021-08-13 DIAGNOSIS — B20 Human immunodeficiency virus [HIV] disease: Secondary | ICD-10-CM

## 2021-08-13 DIAGNOSIS — G4700 Insomnia, unspecified: Secondary | ICD-10-CM

## 2021-08-13 DIAGNOSIS — R030 Elevated blood-pressure reading, without diagnosis of hypertension: Secondary | ICD-10-CM

## 2021-08-13 DIAGNOSIS — A539 Syphilis, unspecified: Secondary | ICD-10-CM

## 2021-08-13 MED ORDER — TRAZODONE HCL 50 MG PO TABS: 50.0000 mg | ORAL_TABLET | Freq: Every day | ORAL | 0 refills | Status: DC

## 2021-08-13 NOTE — Progress Notes (Signed)
[facility]    There are no problems to display for this patient.  Cc- hiv tx follow up   HPI: Keith Blackwell is a 36 y.o. male who was just tested positive for hiv a few days ago.   He came to the ED for an itchy rash on 4/10; given steroid cream. Tested for rpr and hiv as well  He was notified a day ago about the positive hiv test He has been with one male partner 3 years -- she is getting testing as of a day prior to this visit  He reports he noticed the rash in September 2022 (first time) -- intensely itch plaque/papular-squamous on in extensors side of bilateral elbows, around the belly button, and also popliteal fossa of the right knee. His grandmother does have psoriasis. No genital lesion   No sign/sx of recent viral illness within the 3 months prior to the hiv test. His last hiv testing was 2019 and was negative. He also have never had positive rpr in the past (no hx syphilis told)  Patient been feeling well otherwise without any abnormal sign/symptoms of concern outside of the rash (denies fever, chill, nightsweat, weightloss, decreased appetite, cough, chest pain, abd pain, n/v/diarrhea, penile discharge, lymph node swelling, headache, numbness/tingling, visual changes)  Social: -moved to [place] 2002, but grew up in [place] (born in [place] -lives with grandmother -GED education level -not working right now -In a relationship of 3 years -- heterosexual relationship -marijuanna smoking; tobacco since age 41, currently about a ppd; no ivdu or other substance  We talked about the new merk study vs biktarvy -- he is interested and Lyden with the research group will talk to him more this visit  He has never been on any hiv drug including PrEP  -------------------- 07/08/21 id f/u    His girlfriend tested 3-4 times ([facility]) and was negative. 3 years monogamous relationship   His last negative  test was 08/2017 right before he knew his girlfriend  Takes biktarvy daily without missed dose  Feels depressed asked about antidepression. No SI/HI. Sleeping is terrible with regard to falling asleep since the diagnosis...  No other complaint   The rash he had previously is gone with topical steroid     08/13/21 id clinic f/u Patient compliant with biktarvy; no missed dose Still complaining of inability to fall asleep, and maintaining sleep He has been trying ambien and melatonin don't remember dose (got the latter otc) but they don't seem to help He is feeling less anxious and depressed Patient is currently not working No si/hi Eating well but it "comes and goes" at time  Still with his girlfriend, who had tested negative.  Asked why his blood pressure here is high. He doesn't check at home    Review of Systems: ROS All other ros negative      Past Medical History:  Diagnosis Date   Environmental allergies    Migraine     Social History   Tobacco Use   Smoking status: Every Day    Packs/day: 1.00    Types: Cigarettes   Smokeless tobacco: Never  Substance Use Topics   Alcohol use: Yes    Comment: occasionally   Drug use: Yes    Frequency: 7.0 times per week    Types: Marijuana    Comment: Marijiuana daily    Fam hx: Grandmother with psoriasis  No Known Allergies  OBJECTIVE: Vitals:   08/13/21 0857  BP: (!) 146/103  Pulse: 88  Temp: 98.3 F (36.8 C)  TempSrc: Temporal  Weight: 258 lb (117 kg)   Body mass index is 37.02 kg/m.   Physical Exam General/constitutional: no distress, pleasant HEENT: Normocephalic, PER, Conj Clear, EOMI, Oropharynx clear Neck supple CV: rrr no mrg Lungs: clear to auscultation, normal respiratory effort Abd: Soft, Nontender Ext: no edema Skin: No Rash Neuro: nonfocal MSK: no peripheral joint swelling/tenderness/warmth; back spines nontender             Lab: Lab Results  Component Value Date    WBC 4.0 07/08/2021   HGB 12.8 (L) 07/08/2021   HCT 42.6 07/08/2021   MCV 79.0 (L) 07/08/2021   PLT 200 07/08/2021   Last metabolic panel Lab Results  Component Value Date   GLUCOSE 85 07/08/2021   NA 139 07/08/2021   K 4.4 07/08/2021   CL 105 07/08/2021   CO2 28 07/08/2021   BUN 15 07/08/2021   CREATININE 1.32 (H) 07/08/2021   GFRNONAA >60 02/25/2021   CALCIUM 9.3 07/08/2021   PROT 7.8 07/08/2021   ALBUMIN 4.2 02/25/2021   BILITOT 0.4 07/08/2021   ALKPHOS 48 02/25/2021   AST 18 07/08/2021   ALT 18 07/08/2021   ANIONGAP 8 02/25/2021    Microbiology:  Serology:  Imaging:   Assessment/plan: Problem List Items Addressed This Visit   None Visit Diagnoses     HIV disease (HCC)    -  Primary   Relevant Orders   CBC with Differential/Platelet   COMPLETE METABOLIC PANEL WITH GFR   HIV-1 RNA quant-no reflex-bld        #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 Explain somebody with receptor mutation might be more resistant in acquiring hiv. Explains that he might have gotten it in 08/2017 but didn't seroconvert yet (was just a hiv antibody test)  Explains to him he'll be almost living a normal life as long as he takes his biktarvy. 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  Resolved with steroid cream clobetasol as of 07/08/21 visit.      #adjustment disorder #insomnia Counsel patient on timing/dx of depression and allowing time for his body to adjust He is having depressed mood and falling  asleep issue but no full blown depression  -still issue with sleeping; ambien and melatonin don't see to work -trial of trazodone 50 mg qhs; if not working elevate to 100 mg qhs after 1 week -can stop [place] -can continue melatonin as needed    #elevated bp Suspect white coat htn  -check daily at home and can discuss at future visit   I have spent a total of 40 minutes of face-to-face and non-face-to-face time, excluding clinical staff time, preparing to see patient, ordering tests and/or medications, and provide counseling the patient    #hcm -vaccinations -hepatitis screening Will do next few visits -std screening  Gc/chlam and syphilis negative 05/13/2021 Fta reactive 07/2021 -tb screening Will  discuss next few visits -cancer screening N/a   Follow-up: No follow-ups on file.  Raymondo Band, MD [facility] [phone] pager   [phone] cell 08/13/2021, 9:20 AM

## 2021-08-13 NOTE — Patient Instructions (Signed)
Labs today  Continue biktarvy   Stop ambien Continue melatonin up to 5-6 mg at night for sleep Trial of trazodone 50 mg at night as needed for sleep; can increase to 100 mg at night in several days if 50 doesn't seem to work   Check blood pressure at home once a day vary the time of check each days. Goal is < 130/90s   See me in 3 months

## 2021-08-15 LAB — COMPLETE METABOLIC PANEL WITH GFR
AG Ratio: 1.3 (calc) (ref 1.0–2.5)
ALT: 19 U/L (ref 9–46)
AST: 20 U/L (ref 10–40)
Albumin: 4.4 g/dL (ref 3.6–5.1)
Alkaline phosphatase (APISO): 63 U/L (ref 36–130)
BUN/Creatinine Ratio: 13 (calc) (ref 6–22)
BUN: 18 mg/dL (ref 7–25)
CO2: 28 mmol/L (ref 20–32)
Calcium: 9.1 mg/dL (ref 8.6–10.3)
Chloride: 106 mmol/L (ref 98–110)
Creat: 1.41 mg/dL — ABNORMAL HIGH (ref 0.60–1.26)
Globulin: 3.4 g/dL (calc) (ref 1.9–3.7)
Glucose, Bld: 89 mg/dL (ref 65–99)
Potassium: 4.4 mmol/L (ref 3.5–5.3)
Sodium: 137 mmol/L (ref 135–146)
Total Bilirubin: 0.6 mg/dL (ref 0.2–1.2)
Total Protein: 7.8 g/dL (ref 6.1–8.1)
eGFR: 67 mL/min/{1.73_m2} (ref >=60)

## 2021-08-15 LAB — CBC WITH DIFFERENTIAL/PLATELET
Absolute Monocytes: 399 cells/uL (ref 200–950)
Basophils Absolute: 29 cells/uL (ref 0–200)
Basophils Relative: 0.7 %
Eosinophils Absolute: 151 cells/uL (ref 15–500)
Eosinophils Relative: 3.6 %
HCT: 45.5 % (ref 38.5–50.0)
Hemoglobin: 13.5 g/dL (ref 13.2–17.1)
Lymphs Abs: 1709 cells/uL (ref 850–3900)
MCH: 23.4 pg — ABNORMAL LOW (ref 27.0–33.0)
MCHC: 29.7 g/dL — ABNORMAL LOW (ref 32.0–36.0)
MCV: 79 fL — ABNORMAL LOW (ref 80.0–100.0)
MPV: 10.5 fL (ref 7.5–12.5)
Monocytes Relative: 9.5 %
Neutro Abs: 1911 cells/uL (ref 1500–7800)
Neutrophils Relative %: 45.5 %
Platelets: 182 10*3/uL (ref 140–400)
RBC: 5.76 10*6/uL (ref 4.20–5.80)
RDW: 14.6 % (ref 11.0–15.0)
Total Lymphocyte: 40.7 %
WBC: 4.2 10*3/uL (ref 3.8–10.8)

## 2021-08-15 LAB — HIV-1 RNA QUANT-NO REFLEX-BLD
HIV 1 RNA Quant: 41 Copies/mL — ABNORMAL HIGH
HIV-1 RNA Quant, Log: 1.62 Log cps/mL — ABNORMAL HIGH

## 2021-08-26 ENCOUNTER — Encounter: Payer: Self-pay | Admitting: Internal Medicine

## 2021-10-04 ENCOUNTER — Other Ambulatory Visit: Payer: Self-pay | Admitting: Internal Medicine

## 2021-10-04 NOTE — Telephone Encounter (Signed)
Okay to continue filling trazodone?

## 2021-11-05 ENCOUNTER — Other Ambulatory Visit: Payer: Self-pay | Admitting: Internal Medicine

## 2021-11-13 ENCOUNTER — Encounter: Payer: Self-pay | Admitting: Internal Medicine

## 2021-11-13 ENCOUNTER — Ambulatory Visit (INDEPENDENT_AMBULATORY_CARE_PROVIDER_SITE_OTHER): Payer: Self-pay | Admitting: Internal Medicine

## 2021-11-13 ENCOUNTER — Other Ambulatory Visit: Payer: Self-pay

## 2021-11-13 VITALS — BP 161/108 | HR 90 | Temp 98.0°F | Ht 71.0 in | Wt 282.0 lb

## 2021-11-13 DIAGNOSIS — Z113 Encounter for screening for infections with a predominantly sexual mode of transmission: Secondary | ICD-10-CM

## 2021-11-13 DIAGNOSIS — B20 Human immunodeficiency virus [HIV] disease: Secondary | ICD-10-CM

## 2021-11-13 NOTE — Progress Notes (Signed)
[facility]    Cc- hiv tx follow up   HPI: Keith Blackwell is a 36 y.o. male dx'ed hiv 2022, here for f/u routine care   Initial visit with me 05/2021: ------------ He came to the ED for an itchy rash on 4/10; given steroid cream. Tested for rpr and hiv as well  He was notified a day ago about the positive hiv test He has been with one male partner 3 years -- she is getting testing as of a day prior to this visit  He reports he noticed the rash in September 2022 (first time) -- intensely itch plaque/papular-squamous on in extensors side of bilateral elbows, around the belly button, and also popliteal fossa of the right knee. His grandmother does have psoriasis. No genital lesion   No sign/sx of recent viral illness within the 3 months prior to the hiv test. His last hiv testing was 2019 and was negative. He also have never had positive rpr in the past (no hx syphilis told)  Patient been feeling well otherwise without any abnormal sign/symptoms of concern outside of the rash (denies fever, chill, nightsweat, weightloss, decreased appetite, cough, chest pain, abd pain, n/v/diarrhea, penile discharge, lymph node swelling, headache, numbness/tingling, visual changes)  Social: -moved to [place] 2002, but grew up in [place] (born in [place]) -lives with grandmother -GED education level -not working right now -In a relationship of 3 years -- heterosexual relationship -marijuanna smoking; tobacco since age 61, currently about a ppd; no ivdu or other substance  We talked about the new merk study vs biktarvy -- he is interested and [place] with the research group will talk to him more this visit  He has never been on any hiv drug including PrEP  -------------------- 07/08/21 id f/u    His girlfriend tested 3-4 times ([facility]) and was negative. 3 years monogamous relationship   His last negative test was 08/2017  right before he knew his girlfriend  Takes biktarvy daily without missed dose  Feels depressed asked about antidepression. No SI/HI. Sleeping is terrible with regard to falling asleep since the diagnosis...  No other complaint   The rash he had previously is gone with topical steroid     08/13/21 id clinic f/u Patient compliant with biktarvy; no missed dose Still complaining of inability to fall asleep, and maintaining sleep He has been trying ambien and melatonin don't remember dose (got the latter otc) but they don't seem to help He is feeling less anxious and depressed Patient is currently not working No si/hi Eating well but it "comes and goes" at time  Still with his girlfriend, who had tested negative.  Asked why his blood pressure here is high. He doesn't check at home   11/13/21 id clinic f/u He complains of depression He also has been hypertensive when he comes 160s/100s Patient works night shift He still smokes He drinks on the weekend, but no binge Takes his hiv med everyday 9am He is still with his girlfriend He doesn't know what is causing his depression (he said he gets depressed when he thinks about the virus)   No hi/si. Some sleeping issue falling asleep after coming home from work. No anhedonia. No interelationship/work interference. No other stressors. No gun at home     Review of Systems: ROS All other ros negative      Past Medical History:  Diagnosis Date   Environmental allergies  Migraine     Social History   Tobacco Use   Smoking status: Every Day    Packs/day: 1.00    Types: Cigarettes   Smokeless tobacco: Never  Substance Use Topics   Alcohol use: Yes    Comment: weekends   Drug use: Yes    Frequency: 7.0 times per week    Types: Marijuana    Comment: Marijiuana daily    Fam hx: Grandmother with psoriasis  No Known Allergies  OBJECTIVE: Vitals:   11/13/21 0908  BP: (!) 161/108  Pulse: 90  Temp: 98 F (36.7  C)  SpO2: 100%  Weight: 282 lb (127.9 kg)  Height: 5\' 11"  (1.803 m)   Body mass index is 39.33 kg/m.   Physical Exam General/constitutional: no distress, pleasant HEENT: Normocephalic, PER, Conj Clear, EOMI, Oropharynx clear Neck supple CV: rrr no mrg Lungs: clear to auscultation, normal respiratory effort Abd: Soft, Nontender Ext: no edema Skin: No Rash Neuro: nonfocal MSK: no peripheral joint swelling/tenderness/warmth; back spines nontender            Lab: Lab Results  Component Value Date   WBC 4.2 08/13/2021   HGB 13.5 08/13/2021   HCT 45.5 08/13/2021   MCV 79.0 (L) 08/13/2021   PLT 182 08/13/2021   Last metabolic panel Lab Results  Component Value Date   GLUCOSE 89 08/13/2021   NA 137 08/13/2021   K 4.4 08/13/2021   CL 106 08/13/2021   CO2 28 08/13/2021   BUN 18 08/13/2021   CREATININE 1.41 (H) 08/13/2021   GFRNONAA >60 02/25/2021   CALCIUM 9.1 08/13/2021   PROT 7.8 08/13/2021   ALBUMIN 4.2 02/25/2021   BILITOT 0.6 08/13/2021   ALKPHOS 48 02/25/2021   AST 20 08/13/2021   ALT 19 08/13/2021   ANIONGAP 8 02/25/2021    Microbiology:  Serology:  Imaging:   Assessment/plan: Problem List Items Addressed This Visit   None Visit Diagnoses     HIV disease (HCC)    -  Primary   Relevant Orders   HIV 1 RNA quant-no reflex-bld   Screening for STDs (sexually transmitted diseases)       Relevant Orders   Urine cytology ancillary only   RPR        #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 Explain somebody with receptor mutation might be more resistant in acquiring hiv. Explains that he might have gotten it in 08/2017 but didn't seroconvert yet (was just a hiv antibody test)  Explains to him he'll be almost living a normal life as long as he takes his biktarvy. He has been 100% compliant   Today 11/13/21 in the setting of  his depression I discuss potentially changing biktarvy to cabenuva or enrolling in the new study with islatravir/doravirine eye witness study. He is open to hearing about the study      -discussed u=u -encourage compliance -continue current HIV medication -labs today -f/u in 6 months     #syphilis #std screen Rpr nonreactive and fta reactive. 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 and treated as such 3 shots pcn by late 07/2021  -repeat rpr and std screen   #elevated blood pressure Patient smokes --> advised stopping Also works night shift and also feeling depressed and also could be white coat related  -advise 7-10 days ambulatory pressure check    #adjustment disorder #insomnia Counsel patient on timing/dx  of depression and allowing time for his body to adjust He is having depressed mood and falling asleep issue but no full blown depression  -he currently takes trazodone 50 mg qhs and it work; advise that he can also takes melatonin -he can titrate up to 150 mg for trazodone -provide counseling resource; he asked about taking meds but I would like him to have counseling and have at least trending phq9 first    #hcm -vaccinations He doesn't feel he needs flu shot -hepatitis screening Immunized/responder to hep b; sAb>10 -std screening  Gc/chlam and syphilis negative 05/13/2021 Fta reactive 07/2021; see syphilis hx above -tb screening -cancer screening N/a   I spent more than 35 minute reviewing data/chart, and coordinating care and >50% direct face to face time providing counseling/discussing diagnostics/treatment plan with patient    Follow-up: No follow-ups on file.  Jabier Mutton, [place] [facility] [place] [phone] pager   [phone] cell 11/13/2021, 9:23 AM

## 2021-11-13 NOTE — Patient Instructions (Addendum)
[facility], [place] [address] [place], [place], [place] [phone] [place], [place] [address] [place], [place], [phone] [facility], [place] [address], [address] [place], [place] [phone] [facility] (many locations): [email or web address]  Counseling Services: [facility] [employer] (www.fsp[facility]ares.org/mental-service-substance-abuse/) [phone]  --------------------     Today: Blood/urine test Follow up in 6 months Make sure you do a 7-10 day home blood pressure check and check different times of day once a day You can also consider stopping smoking to help with blood pressure I will have [facility] research team talk to you about the new medication trial You can also think about the every other month injection with cabenuva  The 2 alternative medications might be a little better off for your mood

## 2021-11-14 LAB — URINE CYTOLOGY ANCILLARY ONLY
Chlamydia: NEGATIVE
Comment: NEGATIVE
Comment: NEGATIVE
Comment: NORMAL
Neisseria Gonorrhea: NEGATIVE
Trichomonas: NEGATIVE

## 2021-11-16 LAB — HIV-1 RNA QUANT-NO REFLEX-BLD
HIV 1 RNA Quant: <20 Copies/mL — ABNORMAL HIGH
HIV-1 RNA Quant, Log: <1.3 Log cps/mL — ABNORMAL HIGH

## 2021-11-16 LAB — RPR: RPR Ser Ql: NONREACTIVE

## 2021-12-16 ENCOUNTER — Encounter: Payer: Self-pay | Admitting: Internal Medicine

## 2022-02-02 IMAGING — DX DG CHEST 2V
2 series · 2 of 2 positions shown · non-contrast
Comparison: 01/05/2015

CLINICAL DATA: Chest pain since lifting boxes 3 days ago.

EXAM:
CHEST - 2 VIEW

[chest pa]
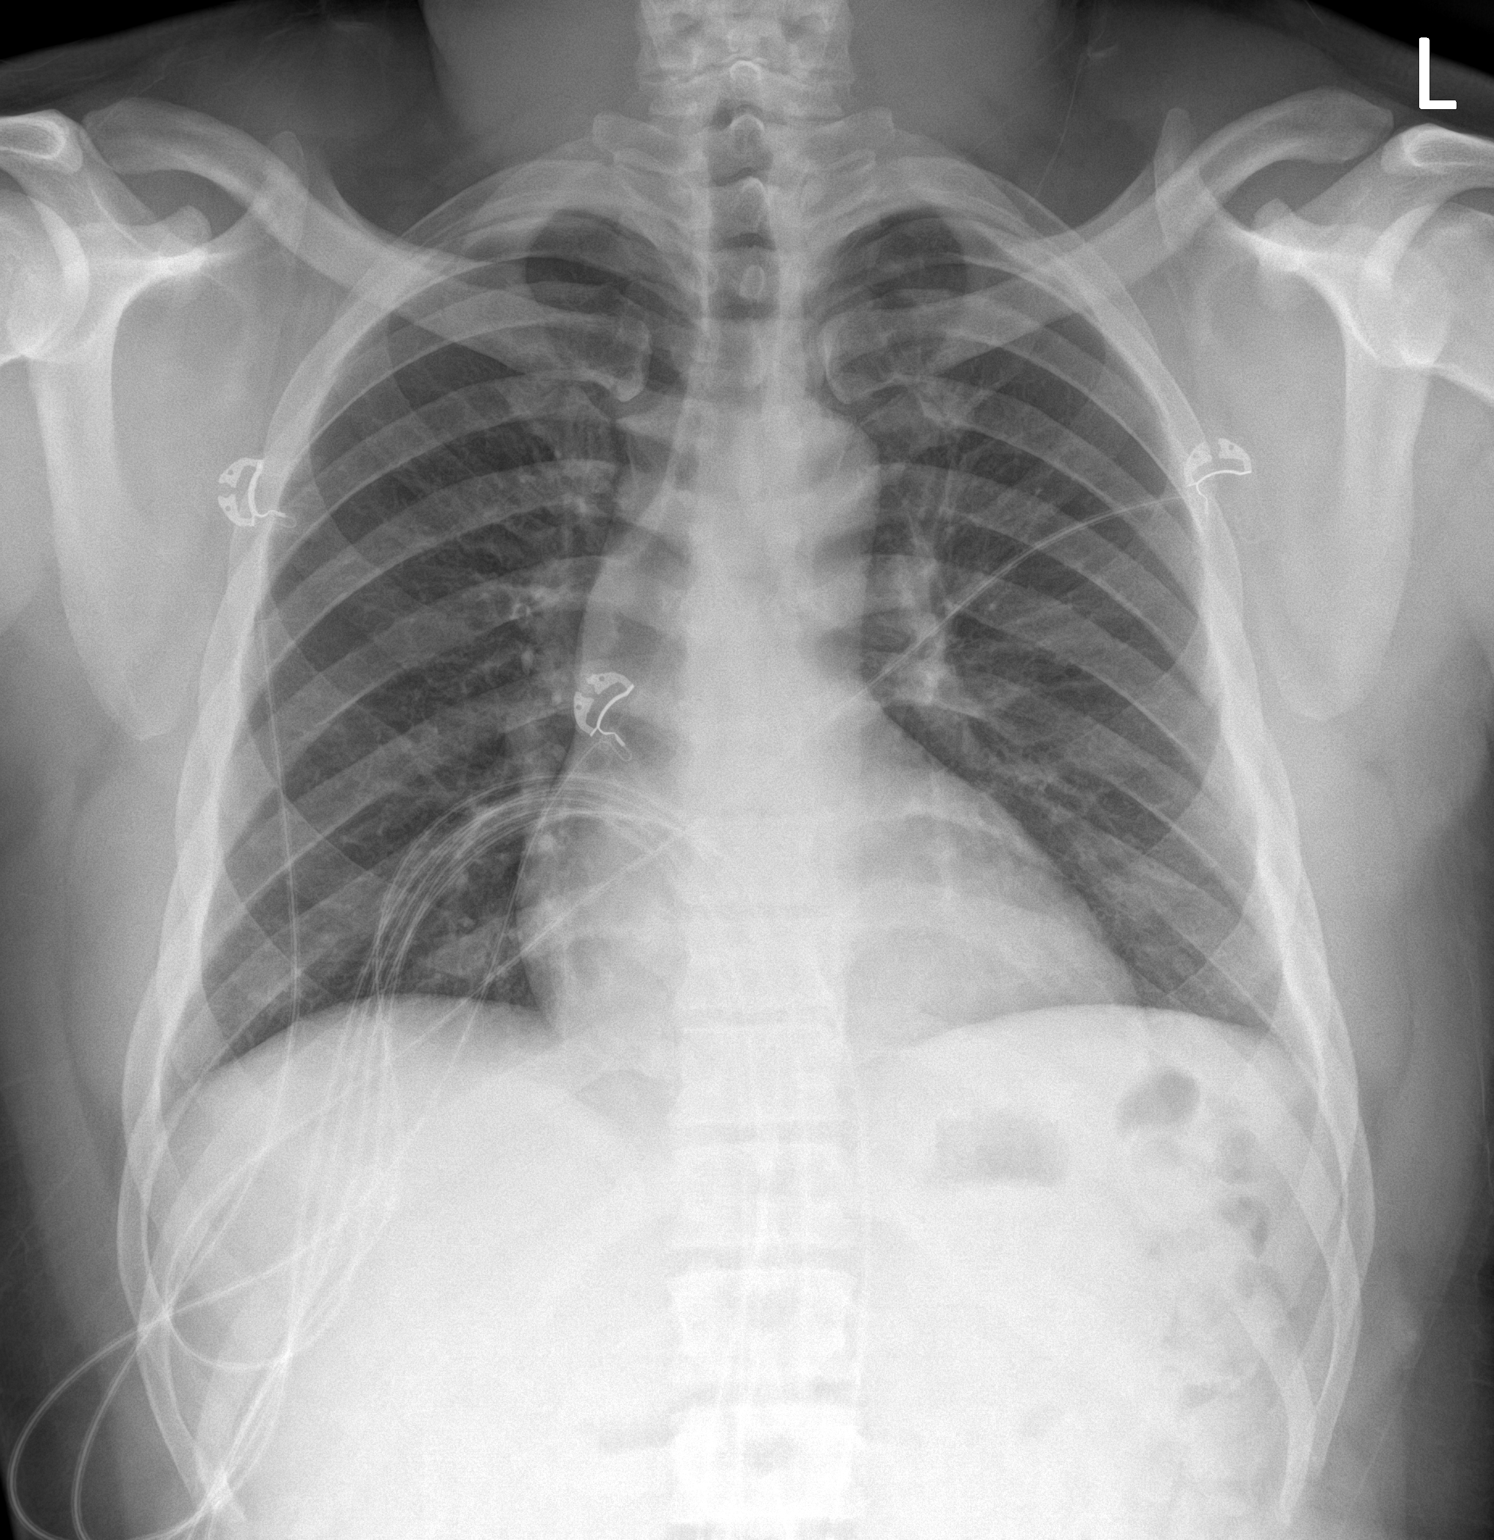

[chest lat]
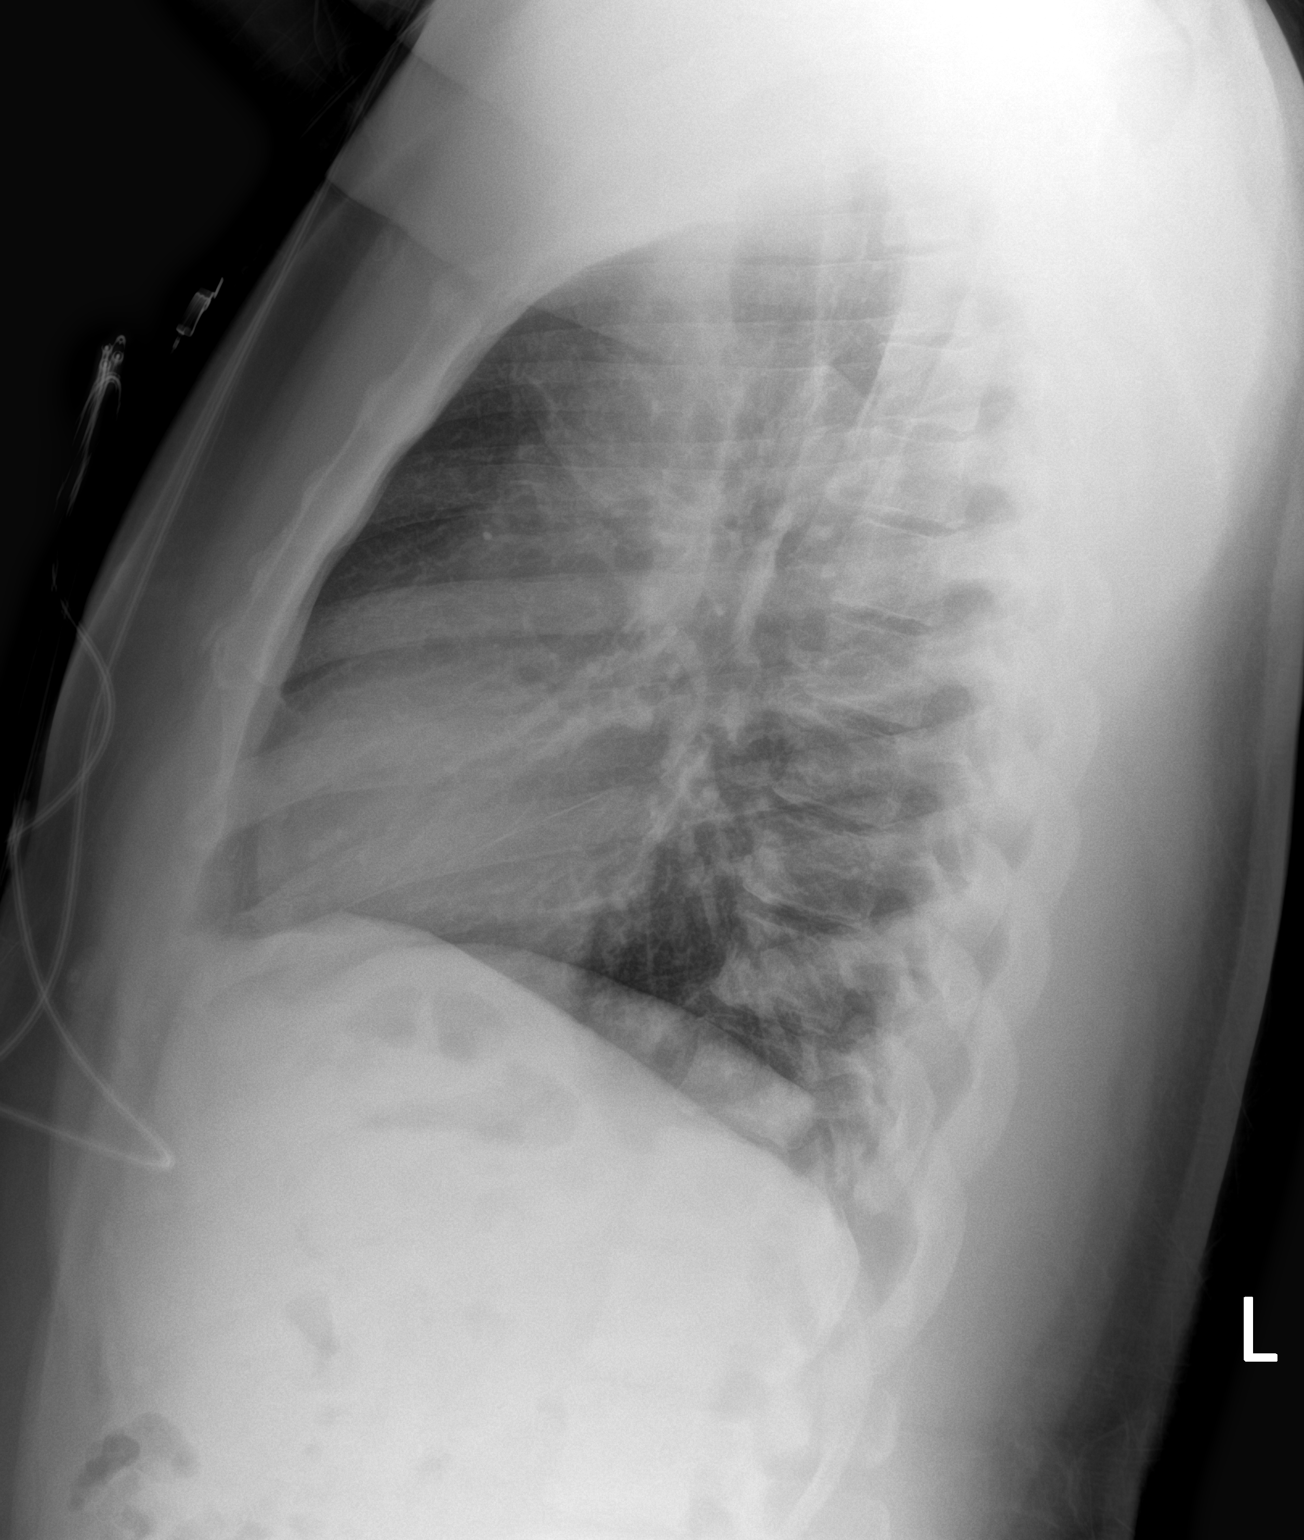

[2 of 2 positions shown; findings below may reference images not displayed]

FINDINGS: The heart size and mediastinal contours are within normal limits.
Both lungs are clear. The visualized skeletal structures are
unremarkable.
IMPRESSION: No active cardiopulmonary disease.

## 2022-02-07 ENCOUNTER — Other Ambulatory Visit: Payer: Self-pay | Admitting: Internal Medicine

## 2022-02-07 DIAGNOSIS — L299 Pruritus, unspecified: Secondary | ICD-10-CM

## 2022-04-08 ENCOUNTER — Other Ambulatory Visit: Payer: Self-pay | Admitting: Internal Medicine

## 2022-04-08 NOTE — Telephone Encounter (Signed)
Okay to refill? 

## 2022-04-30 ENCOUNTER — Other Ambulatory Visit: Payer: Self-pay | Admitting: Internal Medicine

## 2022-04-30 DIAGNOSIS — G4700 Insomnia, unspecified: Secondary | ICD-10-CM

## 2022-04-30 NOTE — Telephone Encounter (Signed)
Do you want to continue filling? If so, do you want to keep at #7 or change to #30? Thanks!

## 2022-04-30 NOTE — Telephone Encounter (Signed)
That's fine we can give him several months

## 2022-05-15 ENCOUNTER — Encounter: Payer: Self-pay | Admitting: Internal Medicine

## 2022-05-15 ENCOUNTER — Other Ambulatory Visit: Payer: Self-pay

## 2022-05-15 ENCOUNTER — Ambulatory Visit (INDEPENDENT_AMBULATORY_CARE_PROVIDER_SITE_OTHER): Payer: Medicaid Other | Admitting: Internal Medicine

## 2022-05-15 VITALS — BP 136/112 | HR 95 | Temp 98.0°F | Resp 16 | Ht 69.0 in | Wt 296.8 lb

## 2022-05-15 DIAGNOSIS — B20 Human immunodeficiency virus [HIV] disease: Secondary | ICD-10-CM | POA: Diagnosis not present

## 2022-05-15 NOTE — Progress Notes (Signed)
[facility]    Cc- hiv tx follow up   HPI: Keith Blackwell is a 37 y.o. male dx'ed hiv 2022, here for f/u routine care   Initial visit with me 05/2021: ------------ He came to the ED for an itchy rash on 4/10; given steroid cream. Tested for rpr and hiv as well  He was notified a day ago about the positive hiv test He has been with one male partner 3 years -- she is getting testing as of a day prior to this visit  He reports he noticed the rash in September 2022 (first time) -- intensely itch plaque/papular-squamous on in extensors side of bilateral elbows, around the belly button, and also popliteal fossa of the right knee. His grandmother does have psoriasis. No genital lesion   No sign/sx of recent viral illness within the 3 months prior to the hiv test. His last hiv testing was 2019 and was negative. He also have never had positive rpr in the past (no hx syphilis told)  Patient been feeling well otherwise without any abnormal sign/symptoms of concern outside of the rash (denies fever, chill, nightsweat, weightloss, decreased appetite, cough, chest pain, abd pain, n/v/diarrhea, penile discharge, lymph node swelling, headache, numbness/tingling, visual changes)  Social: -[place], [place] -lives with grandmother -GED education level -not working right now -In a relationship of 3 years -- heterosexual relationship -marijuanna smoking; tobacco since age 37, currently about a ppd; no ivdu or other substance  We talked about the new merk study vs biktarvy -- he is interested and [place] with the research group will talk to him more this visit  He has never been on any hiv drug including PrEP  -------------------- 07/08/21 id f/u    His girlfriend tested 3-4 times ([facility]) and was negative. 3 years monogamous relationship   His last negative test was 08/2017  right before he knew his girlfriend  Takes biktarvy daily without missed dose  Feels depressed asked about antidepression. No SI/HI. Sleeping is terrible with regard to falling asleep since the diagnosis...  No other complaint   The rash he had previously is gone with topical steroid     08/13/21 id clinic f/u Patient compliant with biktarvy; no missed dose Still complaining of inability to fall asleep, and maintaining sleep He has been trying ambien and melatonin don't remember dose (got the latter otc) but they don't seem to help He is feeling less anxious and depressed Patient is currently not working No si/hi Eating well but it "comes and goes" at time  Still with his girlfriend, who had tested negative.  Asked why his blood pressure here is high. He doesn't check at home   11/13/21 id clinic f/u He complains of depression He also has been hypertensive when he comes 160s/100s Patient works night shift He still smokes He drinks on the weekend, but no binge Takes his hiv med everyday 9am He is still with his girlfriend He doesn't know what is causing his depression (he said he gets depressed when he thinks about the virus)   No hi/si. Some sleeping issue falling asleep after coming home from work. No anhedonia. No interelationship/work interference. No other stressors. No gun at home   05/15/22 id f/u Girlfriend taking prep, has hair falling out and wondering if she needs it. Patient has been < 50 since 08/2021 No missed dose  of biktarvy last 4 weeks He is thinking about cabenuva and will talk to his girflriend more No plan to have kids by his girlfriend  No concern today     Review of Systems: ROS All other ros negative      Past Medical History:  Diagnosis Date   Environmental allergies    Migraine     Social History   Tobacco Use   Smoking status: Every Day    Packs/day: 1    Types: Cigarettes   Smokeless tobacco: Never  Substance Use Topics    Alcohol use: Yes    Comment: weekends   Drug use: Yes    Frequency: 7.0 times per week    Types: Marijuana    Comment: Marijiuana daily    Fam hx: Grandmother with psoriasis  No Known Allergies  OBJECTIVE: Vitals:   05/15/22 0840  BP: (!) 169/116  Pulse: 95  Resp: 16  Temp: 98 F (36.7 C)  TempSrc: Oral  SpO2: 98%  Weight: 296 lb 12.8 oz (134.6 kg)  Height: 5\' 9"  (1.753 m)   Body mass index is 43.83 kg/m.   Physical Exam General/constitutional: no distress, pleasant HEENT: Normocephalic, PER, Conj Clear, EOMI, Oropharynx clear Neck supple CV: rrr no mrg Lungs: clear to auscultation, normal respiratory effort Abd: Soft, Nontender Ext: no edema Skin: No Rash Neuro: nonfocal MSK: no peripheral joint swelling/tenderness/warmth; back spines nontender             Lab: Lab Results  Component Value Date   WBC 4.2 08/13/2021   HGB 13.5 08/13/2021   HCT 45.5 08/13/2021   MCV 79.0 (L) 08/13/2021   PLT 182 08/13/2021   Last metabolic panel Lab Results  Component Value Date   GLUCOSE 89 08/13/2021   NA 137 08/13/2021   K 4.4 08/13/2021   CL 106 08/13/2021   CO2 28 08/13/2021   BUN 18 08/13/2021   CREATININE 1.41 (H) 08/13/2021   GFRNONAA >60 02/25/2021   CALCIUM 9.1 08/13/2021   PROT 7.8 08/13/2021   ALBUMIN 4.2 02/25/2021   BILITOT 0.6 08/13/2021   ALKPHOS 48 02/25/2021   AST 20 08/13/2021   ALT 19 08/13/2021   ANIONGAP 8 02/25/2021   HIV: 11/2021   <20 08/2021     41 07/2021                  /     119 (8%) 05/2021    111k    05/2021 hiv genotype Comment: HIV Subtype: B ___________________________________________________________ Antiretroviral drugs      Resistance  Mutations Detected                           Predicted ___________________________________________________________                                 !   !       NRTIs                     !   ! ZDV (zidovudine or Retrovir)    ! NO!     ABC (abacavir or Ziagen)        !  NO!     ddI (didanosine or Videx)       ! NO!     3TC (lamivudine or Epivir)      ! NO!  FTC (emtricitabine or Emtriva)  ! NO!     d4T (stavudine or Zerit)        ! NO!     TDF (tenofovir or Viread)       ! NO!     ________________________________!___!______________________                                 !   !       NNRTIs                    !   ! ETR (etravirine or Intelence)   ! NO!     EFV (efavirenz or Sustiva)      ! NO!     NVP (nevirapine or Viramune)    ! NO!     RPV (rilpivirine or Edurant)    ! NO!     DOR (doravirine or Pifeltro)    ! NO!     ________________________________!___!______________________                                 !   !       PIs                       !   ! FPV (fos-amprenavir or Lexiva)  ! NO!     IDV (indinavir or Crixivan)     ! NO!     NFV (nelfinavir or Viracept)    ! NO!     SQV (saquinavir or Invirase)    ! NO!     LPV (lopinavir or Kaletra)      ! NO!     ATV (atazanavir or Reyataz)     ! NO!     TPV (tipranavir or Aptivus)     ! NO!     DRV (darunavir or Prezista)     ! NO!                                     !   ! ________________________________!___!______________________ PRB = PROBABLE OR EMERGING RESISTANCE OTHER MUTATIONS DETECTED: RT GENE MUTATIONS: R211K PR GENE MUTATIONS: G16A,I62V,L63T,V77I ___________________________________________________________     Microbiology:  Serology:  Imaging:   Assessment/plan: Problem List Items Addressed This Visit   None Visit Diagnoses     HIV disease    -  Primary   Relevant Orders   T-helper cells (CD4) count   HIV 1 RNA quant-no reflex-bld   CBC   COMPLETE METABOLIC PANEL WITH GFR       #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 Explain somebody with receptor mutation might be more resistant in acquiring hiv. Explains that he might have gotten it in 08/2017  but didn't seroconvert yet (was just a hiv antibody test)  Explains to him he'll be almost living a normal life as long as he takes his biktarvy. He has been 100% compliant   05/15/22 he asked about cabenuva again/side effect. He seems interested, but will talk to his girlfriend again before deciding. His gf is taking prep and has some hair loss which she thinks is due to prep.  I discussed there has been no sexual transmission when he is undetectable which he has been (<50) since 08/2021.     -discussed u=u -encourage compliance -continue current HIV medication -labs today -f/u in 6 months     #syphilis #std screen Rpr nonreactive and fta reactive. 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 Remains nonreactive rpr 11/2021  -deferred today 05/15/22   #adjustment disorder #insomnia Counsel patient on timing/dx of depression and allowing time for his body to adjust He is having depressed mood and falling asleep issue but no full blown depression Taking trazodone 50 mg qhs and it work; advised previously that he can also takes melatonin  -no issue today   #hcm -vaccinations He doesn't feel he needs flu shot -hepatitis screening Immunized/responder to hep b; sAb>10 -std screening  Gc/chlam and syphilis negative 05/13/2021 Fta reactive 07/2021; see syphilis hx above 05/15/22 didn't want std testing -tb screening -cancer screening N/a   I have spent a total of 30 minutes of face-to-face and non-face-to-face time, excluding clinical staff time, preparing to see patient, ordering tests and/or medications, and provide counseling the patient     Follow-up: Return in about 6 months (around 11/14/2022).  Raymondo Band, MD [facility] [phone] pager   [phone] cell 05/15/2022, 8:54 AM

## 2022-05-15 NOTE — Patient Instructions (Signed)
Let me know what you want to do with cabenuva. Once you decide you want it, will forward your case to our pharmacy team to run authorization and if approved they'll call you    You girflriend can stop taking PrEp. You basically have been undetectable, which means no transmission sexually.    Labs today   See me 6 months again

## 2022-05-16 LAB — T-HELPER CELLS (CD4) COUNT (NOT AT ARMC)
CD4 % Helper T Cell: 14 % — ABNORMAL LOW (ref 33–65)
CD4 T Cell Abs: 234 /uL — ABNORMAL LOW (ref 400–1790)

## 2022-05-18 LAB — COMPLETE METABOLIC PANEL WITH GFR
AG Ratio: 1.3 (calc) (ref 1.0–2.5)
ALT: 25 U/L (ref 9–46)
AST: 22 U/L (ref 10–40)
Albumin: 4.3 g/dL (ref 3.6–5.1)
Alkaline phosphatase (APISO): 89 U/L (ref 36–130)
BUN: 13 mg/dL (ref 7–25)
CO2: 26 mmol/L (ref 20–32)
Calcium: 9.3 mg/dL (ref 8.6–10.3)
Chloride: 106 mmol/L (ref 98–110)
Creat: 1.23 mg/dL (ref 0.60–1.26)
Globulin: 3.2 g/dL (calc) (ref 1.9–3.7)
Glucose, Bld: 107 mg/dL — ABNORMAL HIGH (ref 65–99)
Potassium: 4 mmol/L (ref 3.5–5.3)
Sodium: 138 mmol/L (ref 135–146)
Total Bilirubin: 0.6 mg/dL (ref 0.2–1.2)
Total Protein: 7.5 g/dL (ref 6.1–8.1)
eGFR: 78 mL/min/{1.73_m2} (ref >=60)

## 2022-05-18 LAB — CBC
HCT: 42.5 % (ref 38.5–50.0)
Hemoglobin: 13 g/dL — ABNORMAL LOW (ref 13.2–17.1)
MCH: 24.6 pg — ABNORMAL LOW (ref 27.0–33.0)
MCHC: 30.6 g/dL — ABNORMAL LOW (ref 32.0–36.0)
MCV: 80.5 fL (ref 80.0–100.0)
MPV: 12.1 fL (ref 7.5–12.5)
Platelets: 199 10*3/uL (ref 140–400)
RBC: 5.28 10*6/uL (ref 4.20–5.80)
RDW: 13.8 % (ref 11.0–15.0)
WBC: 6.4 10*3/uL (ref 3.8–10.8)

## 2022-05-18 LAB — HIV-1 RNA QUANT-NO REFLEX-BLD
HIV 1 RNA Quant: 59 Copies/mL — ABNORMAL HIGH
HIV-1 RNA Quant, Log: 1.77 Log cps/mL — ABNORMAL HIGH

## 2022-05-26 ENCOUNTER — Other Ambulatory Visit: Payer: Self-pay | Admitting: Internal Medicine

## 2022-05-26 DIAGNOSIS — B20 Human immunodeficiency virus [HIV] disease: Secondary | ICD-10-CM

## 2022-06-23 ENCOUNTER — Other Ambulatory Visit: Payer: Self-pay

## 2022-06-23 DIAGNOSIS — Z7689 Persons encountering health services in other specified circumstances: Secondary | ICD-10-CM

## 2022-08-15 ENCOUNTER — Ambulatory Visit: Payer: Medicaid Other | Admitting: Family Medicine

## 2022-08-15 ENCOUNTER — Encounter: Payer: Self-pay | Admitting: Family Medicine

## 2022-08-15 VITALS — BP 144/100 | HR 95 | Temp 97.8°F | Ht 69.0 in | Wt 298.0 lb

## 2022-08-15 DIAGNOSIS — B20 Human immunodeficiency virus [HIV] disease: Secondary | ICD-10-CM

## 2022-08-15 DIAGNOSIS — R0683 Snoring: Secondary | ICD-10-CM | POA: Diagnosis not present

## 2022-08-15 DIAGNOSIS — Z6841 Body Mass Index (BMI) 40.0 and over, adult: Secondary | ICD-10-CM

## 2022-08-15 DIAGNOSIS — I10 Essential (primary) hypertension: Secondary | ICD-10-CM | POA: Diagnosis not present

## 2022-08-15 DIAGNOSIS — Z9189 Other specified personal risk factors, not elsewhere classified: Secondary | ICD-10-CM | POA: Insufficient documentation

## 2022-08-15 DIAGNOSIS — Z7689 Persons encountering health services in other specified circumstances: Secondary | ICD-10-CM

## 2022-08-15 DIAGNOSIS — G4719 Other hypersomnia: Secondary | ICD-10-CM | POA: Insufficient documentation

## 2022-08-15 DIAGNOSIS — F17200 Nicotine dependence, unspecified, uncomplicated: Secondary | ICD-10-CM

## 2022-08-15 DIAGNOSIS — R400 Somnolence: Secondary | ICD-10-CM

## 2022-08-15 LAB — LIPID PANEL
Cholesterol: 181 mg/dL (ref 0–200)
HDL: 27.5 mg/dL — ABNORMAL LOW (ref >39.00)
NonHDL: 153.61
Total CHOL/HDL Ratio: 7
Triglycerides: 282 mg/dL — ABNORMAL HIGH (ref 0.0–149.0)
VLDL: 56.4 mg/dL — ABNORMAL HIGH (ref 0.0–40.0)

## 2022-08-15 LAB — POC URINALSYSI DIPSTICK (AUTOMATED)
Bilirubin, UA: NEGATIVE
Blood, UA: NEGATIVE
Glucose, UA: NEGATIVE
Ketones, UA: NEGATIVE
Leukocytes, UA: NEGATIVE
Nitrite, UA: NEGATIVE
Protein, UA: NEGATIVE
Spec Grav, UA: 1.015 (ref 1.010–1.025)
Urobilinogen, UA: 0.2 E.U./dL
pH, UA: 6.5 (ref 5.0–8.0)

## 2022-08-15 LAB — BASIC METABOLIC PANEL
BUN: 14 mg/dL (ref 6–23)
CO2: 27 mEq/L (ref 19–32)
Calcium: 9.5 mg/dL (ref 8.4–10.5)
Chloride: 104 mEq/L (ref 96–112)
Creatinine, Ser: 1.22 mg/dL (ref 0.40–1.50)
GFR: 76.35 mL/min (ref >60.00)
Glucose, Bld: 92 mg/dL (ref 70–99)
Potassium: 3.9 mEq/L (ref 3.5–5.1)
Sodium: 137 mEq/L (ref 135–145)

## 2022-08-15 LAB — HEMOGLOBIN A1C: Hgb A1c MFr Bld: 5.4 % (ref 4.6–6.5)

## 2022-08-15 LAB — T4, FREE: Free T4: 0.81 ng/dL (ref 0.60–1.60)

## 2022-08-15 LAB — LDL CHOLESTEROL, DIRECT: Direct LDL: 76 mg/dL

## 2022-08-15 LAB — TSH: TSH: 1.09 u[IU]/mL (ref 0.35–5.50)

## 2022-08-15 MED ORDER — AMLODIPINE BESYLATE 5 MG PO TABS
5.0000 mg | ORAL_TABLET | Freq: Every day | ORAL | 1 refills | Status: DC
Start: 2022-08-15 — End: 2022-10-08

## 2022-08-15 NOTE — Assessment & Plan Note (Signed)
Check labs and order HST

## 2022-08-15 NOTE — Progress Notes (Signed)
New Patient Office Visit  Subjective    Patient ID: Keith Blackwell, male    DOB: 1985-08-30  Age: 37 y.o. MRN: [id number]  CC:  Chief Complaint  Patient presents with   Establish Care    Would like to discuss BP and allergies    HPI Keith Blackwell presents to establish care No PCP  Other providers:  [facility] for [facility]  HTN- elevated for years and denies being on medication.  Family history of HTN in mother   Works nights at [employer]    Headaches upon awakening Snores   Smokes cigarettes and marijuana    Outpatient Encounter Medications as of 08/15/2022  Medication Sig   amLODipine (NORVASC) 5 MG tablet Take 1 tablet (5 mg total) by mouth daily.   bictegravir-emtricitabine-tenofovir AF (BIKTARVY) 50-200-25 MG TABS tablet TAKE 1 TABLET BY MOUTH DAILY   cetirizine (ZYRTEC) 10 MG tablet Take 10 mg by mouth daily.   clobetasol cream (TEMOVATE) 0.05 % Apply 1 application. topically 2 (two) times daily.   fluticasone (FLONASE) 50 MCG/ACT nasal spray Place 1 spray into both nostrils daily.   hydrOXYzine (VISTARIL) 25 MG capsule Take 1 capsule (25 mg total) by mouth 3 (three) times daily as needed.   ibuprofen (ADVIL,MOTRIN) 800 MG tablet Take 1 tablet (800 mg total) by mouth every 8 (eight) hours as needed.   naproxen (NAPROSYN) 500 MG tablet Take 1 tablet (500 mg total) by mouth 2 (two) times daily.   traZODone (DESYREL) 50 MG tablet TAKE 1 TABLET BY MOUTH EVERY NIGHT AT BEDTIME   [DISCONTINUED] ALPRAZolam (XANAX PO) Take by mouth. (Patient not taking: Reported on 11/13/2021)   [DISCONTINUED] benzonatate (TESSALON) 100 MG capsule Take 1 capsule (100 mg total) by mouth every 8 (eight) hours. (Patient not taking: Reported on 05/30/2021)   [DISCONTINUED] clotrimazole (LOTRIMIN) 1 % cream Apply to affected area 2 times daily (Patient not taking: Reported on 05/30/2021)   [DISCONTINUED] diclofenac sodium (VOLTAREN) 1 % GEL Apply 4 g topically 4 (four) times daily. (Patient not taking:  Reported on 05/30/2021)   [DISCONTINUED] hydrocortisone 2.5 % lotion Apply topically 2 (two) times daily. (Patient not taking: Reported on 07/08/2021)   [DISCONTINUED] meloxicam (MOBIC) 15 MG tablet Take 1 tablet (15 mg total) by mouth daily. Take 1 daily with food. (Patient not taking: Reported on 05/30/2021)   [DISCONTINUED] methocarbamol (ROBAXIN) 500 MG tablet Take 1-2 tablets (500-1,000 mg total) by mouth every 8 (eight) hours as needed (pain). (Patient not taking: Reported on 05/30/2021)   [DISCONTINUED] ondansetron (ZOFRAN-ODT) 4 MG disintegrating tablet Take 1 tablet (4 mg total) by mouth every 8 (eight) hours as needed for nausea or vomiting. (Patient not taking: Reported on 05/30/2021)   [DISCONTINUED] oseltamivir (TAMIFLU) 75 MG capsule Take 1 capsule (75 mg total) by mouth every 12 (twelve) hours. (Patient not taking: Reported on 05/30/2021)   [DISCONTINUED] predniSONE (STERAPRED UNI-PAK 21 TAB) 10 MG (21) TBPK tablet Take by mouth daily. Take 6 tabs by mouth daily  for 2 days, then 5 tabs for 2 days, then 4 tabs for 2 days, then 3 tabs for 2 days, 2 tabs for 2 days, then 1 tab by mouth daily for 2 days (Patient not taking: Reported on 05/30/2021)   [DISCONTINUED] terbinafine (LAMISIL AT) 1 % cream Apply 1 application topically 2 (two) times daily. (Patient not taking: Reported on 01/05/2015)   [DISCONTINUED] zolpidem (AMBIEN) 10 MG tablet Take 1 tablet (10 mg total) by mouth at bedtime as needed for  sleep. Try after 1 week use of melatonin and that doesn't work (Patient not taking: Reported on 11/13/2021)   No facility-administered encounter medications on file as of 08/15/2022.    Past Medical History:  Diagnosis Date   Environmental allergies    Migraine    Pain in left knee 10/15/2017   Pain in right knee 12/22/2017   Strain of calf muscle 10/15/2017    Past Surgical History:  Procedure Laterality Date   HERNIA REPAIR      History reviewed. No pertinent family history.  Social  History   Socioeconomic History   Marital status: Significant Other    Spouse name: Not on file   Number of children: Not on file   Years of education: Not on file   Highest education level: Not on file  Occupational History   Not on file  Tobacco Use   Smoking status: Every Day    Current packs/day: 1.00    Types: Cigarettes   Smokeless tobacco: Never  Substance and Sexual Activity   Alcohol use: Yes    Comment: weekends   Drug use: Yes    Frequency: 7.0 times per week    Types: Marijuana    Comment: Marijiuana daily   Sexual activity: Yes    Partners: Female    Comment: accepted condoms  Other Topics Concern   Not on file  Social History Narrative   Not on file   Social Determinants of Health   Financial Resource Strain: Not on file  Food Insecurity: Not on file  Transportation Needs: Not on file  Physical Activity: Not on file  Stress: Not on file  Social Connections: Not on file  Intimate Partner Violence: Not on file    Review of Systems  Constitutional:  Positive for malaise/fatigue. Negative for chills, fever and weight loss.  Respiratory:  Negative for cough and shortness of breath.   Cardiovascular:  Negative for chest pain, palpitations and leg swelling.  Gastrointestinal:  Negative for abdominal pain, constipation, diarrhea, nausea and vomiting.  Genitourinary:  Negative for dysuria, frequency and urgency.  Neurological:  Positive for headaches. Negative for dizziness, sensory change and focal weakness.  Endo/Heme/Allergies:  Negative for polydipsia.  Psychiatric/Behavioral:  Negative for depression. The patient is not nervous/anxious.         Objective    BP (!) 144/100 (BP Location: Left Arm, Patient Position: Supine, Cuff Size: Large)   Pulse 95   Temp 97.8 F (36.6 C) (Temporal)   Ht 5\' 9"  (1.753 m)   Wt 298 lb (135.2 kg)   SpO2 98%   BMI 44.01 kg/m   Physical Exam Constitutional:      General: He is not in acute distress.     Appearance: He is obese. He is not ill-appearing.  HENT:     Mouth/Throat:     Mouth: Mucous membranes are moist.     Pharynx: Oropharynx is clear.  Eyes:     Extraocular Movements: Extraocular movements intact.     Conjunctiva/sclera: Conjunctivae normal.  Cardiovascular:     Rate and Rhythm: Normal rate and regular rhythm.     Comments: EKG shows NSR, rate 91, no Q waves or acute ST-T wave changes  Pulmonary:     Effort: Pulmonary effort is normal.     Breath sounds: Normal breath sounds.  Musculoskeletal:     Cervical back: Normal range of motion and neck supple.     Right lower leg: No edema.     Left  lower leg: No edema.  Skin:    General: Skin is warm and dry.  Neurological:     General: No focal deficit present.     Mental Status: He is alert and oriented to person, place, and time.  Psychiatric:        Mood and Affect: Mood normal.        Behavior: Behavior normal.        Thought Content: Thought content normal.         Assessment & Plan:   Problem List Items Addressed This Visit       Cardiovascular and Mediastinum   Uncontrolled hypertension - Primary    In depth counseling on HTN, health risks and how to control by reducing modifiable risk factors and medications.  Look for secondary causes.  UA negative  Seems likely that he has sleep apnea. HST ordered.  Start amlodipine. He is concerned and declines any medications that could interfere with sexual function.  Monitor BP at home.  DASH diet handout provided.  Follow up in 4 weeks.       Relevant Medications   amLODipine (NORVASC) 5 MG tablet   Other Relevant Orders   EKG 12-Lead (Completed)   POCT Urinalysis Dipstick (Automated) (Completed)   Basic metabolic panel (Completed)   TSH (Completed)   T4, free (Completed)   Lipid panel (Completed)   Aldosterone + renin activity w/ ratio   Home sleep test     Other   At risk for sleep apnea    Epworth 16 HST ordered      Relevant Orders   Home  sleep test   Current every day smoker    Encourage smoking cessation. Discussed health risks associated with smoking      Daytime somnolence    Check labs and order HST      Relevant Orders   Home sleep test   HIV disease (HCC)   Obesity, morbid (HCC)    Counseling on health consequences of obesity. Check labs to look for complications.  Counseling on diet and exercise.  Screen for sleep apnea      Relevant Orders   Hemoglobin A1c (Completed)   TSH (Completed)   T4, free (Completed)   Lipid panel (Completed)   Snores    Order HST      Relevant Orders   Home sleep test   Other Visit Diagnoses     Encounter to establish care           Return in about 1 month (around 09/16/2022) for hypertension.   Hetty Blend, NP-C

## 2022-08-15 NOTE — Assessment & Plan Note (Signed)
Epworth 16 HST ordered

## 2022-08-15 NOTE — Assessment & Plan Note (Signed)
In depth counseling on HTN, health risks and how to control by reducing modifiable risk factors and medications.  Look for secondary causes.  UA negative  Seems likely that he has sleep apnea. HST ordered.  Start amlodipine. He is concerned and declines any medications that could interfere with sexual function.  Monitor BP at home.  DASH diet handout provided.  Follow up in 4 weeks.

## 2022-08-15 NOTE — Assessment & Plan Note (Signed)
Encourage smoking cessation. Discussed health risks associated with smoking

## 2022-08-15 NOTE — Patient Instructions (Addendum)
Please go downstairs for labs.   Start amlodipine 5 mg daily.   Buy a blood pressure machine for your upper arm and start checking your blood pressure daily for the next 4 weeks.   Stop using salt if you do. Look for foods low in sodium. See the DASH handout.   Work on stopping smoking.   You will receive a call from [facility] to schedule a visit to discuss sleep test.    Hypertension, Adult Hypertension is another name for high blood pressure. High blood pressure forces your heart to work harder to pump blood. This can cause problems over time. There are two numbers in a blood pressure reading. There is a top number (systolic) over a bottom number (diastolic). It is best to have a blood pressure that is below 120/80. What are the causes? The cause of this condition is not known. Some other conditions can lead to high blood pressure. What increases the risk? Some lifestyle factors can make you more likely to develop high blood pressure: Smoking. Not getting enough exercise or physical activity. Being overweight. Having too much fat, sugar, calories, or salt (sodium) in your diet. Drinking too much alcohol. Other risk factors include: Having any of these conditions: Heart disease. Diabetes. High cholesterol. Kidney disease. Obstructive sleep apnea. Having a family history of high blood pressure and high cholesterol. Age. The risk increases with age. Stress. What are the signs or symptoms? High blood pressure may not cause symptoms. Very high blood pressure (hypertensive crisis) may cause: Headache. Fast or uneven heartbeats (palpitations). Shortness of breath. Nosebleed. Vomiting or feeling like you may vomit (nauseous). Changes in how you see. Very bad chest pain. Feeling dizzy. Seizures. How is this treated? This condition is treated by making healthy lifestyle changes, such as: Eating healthy foods. Exercising more. Drinking less alcohol. Your  doctor may prescribe medicine if lifestyle changes do not help enough and if: Your top number is above 130. Your bottom number is above 80. Your personal target blood pressure may vary. Follow these instructions at home: Eating and drinking  If told, follow the DASH eating plan. To follow this plan: Fill one half of your plate at each meal with fruits and vegetables. Fill one fourth of your plate at each meal with whole grains. Whole grains include whole-wheat pasta, brown rice, and whole-grain bread. Eat or drink low-fat dairy products, such as skim milk or low-fat yogurt. Fill one fourth of your plate at each meal with low-fat (lean) proteins. Low-fat proteins include fish, chicken without skin, eggs, beans, and tofu. Avoid fatty meat, cured and processed meat, or chicken with skin. Avoid pre-made or processed food. Limit the amount of salt in your diet to less than 1,500 mg each day. Do not drink alcohol if: Your doctor tells you not to drink. You are pregnant, may be pregnant, or are planning to become pregnant. If you drink alcohol: Limit how much you have to: 0-1 drink a day for women. 0-2 drinks a day for men. Know how much alcohol is in your drink. In the [place] one drink equals one 12 oz bottle of beer (355 mL), one 5 oz glass of wine (148 mL), or one 1 oz glass of hard liquor (44 mL). Lifestyle  Work with your doctor to stay at a healthy weight or to lose weight. Ask your doctor what the best weight is for you. Get at least 30 minutes of exercise that causes your heart to beat faster (  aerobic exercise) most days of the week. This may include walking, swimming, or biking. Get at least 30 minutes of exercise that strengthens your muscles (resistance exercise) at least 3 days a week. This may include lifting weights or doing Pilates. Do not smoke or use any products that contain nicotine or tobacco. If you need help quitting, ask your doctor. Check your blood pressure at home as  told by your doctor. Keep all follow-up visits. Medicines Take over-the-counter and prescription medicines only as told by your doctor. Follow directions carefully. Do not skip doses of blood pressure medicine. The medicine does not work as well if you skip doses. Skipping doses also puts you at risk for problems. Ask your doctor about side effects or reactions to medicines that you should watch for. Contact a doctor if: You think you are having a reaction to the medicine you are taking. You have headaches that keep coming back. You feel dizzy. You have swelling in your ankles. You have trouble with your vision. Get help right away if: You get a very bad headache. You start to feel mixed up (confused). You feel weak or numb. You feel faint. You have very bad pain in your: Chest. Belly (abdomen). You vomit more than once. You have trouble breathing. These symptoms may be an emergency. Get help right away. Call 911. Do not wait to see if the symptoms will go away. Do not drive yourself to the hospital. Summary Hypertension is another name for high blood pressure. High blood pressure forces your heart to work harder to pump blood. For most people, a normal blood pressure is less than 120/80. Making healthy choices can help lower blood pressure. If your blood pressure does not get lower with healthy choices, you may need to take medicine. This information is not intended to replace advice given to you by your health care provider. Make sure you discuss any questions you have with your health care provider. Document Revised: 11/08/2020 Document Reviewed: 11/08/2020 Elsevier Patient Education  2024 [employer] Inc[employer]   Managing the Challenge of Quitting Smoking Quitting smoking is a physical and mental challenge. You may have cravings, withdrawal symptoms, and temptation to smoke. Before quitting, work with your health care provider to make a plan that can help you manage quitting. Making a  plan before you quit may keep you from smoking when you have the urge to smoke while trying to quit. How to manage lifestyle changes Managing stress Stress can make you want to smoke, and wanting to smoke may cause stress. It is important to find ways to manage your stress. You could try some of the following: Practice relaxation techniques. Breathe slowly and deeply, in through your nose and out through your mouth. Listen to music. Soak in a bath or take a shower. Imagine a peaceful place or vacation. Get some support. Talk with family or friends about your stress. Join a support group. Talk with a counselor or therapist. Get some physical activity. Go for a walk, run, or bike ride. Play a favorite sport. Practice yoga.  Medicines Talk with your health care provider about medicines that might help you deal with cravings and make quitting easier for you. Relationships Social situations can be difficult when you are quitting smoking. To manage this, you can: Avoid parties and other social situations where people might be smoking. Avoid alcohol. Leave right away if you have the urge to smoke. Explain to your family and friends that you are quitting smoking. Ask for  support and let them know you might be a bit grumpy. Plan activities where smoking is not an option. General instructions Be aware that many people gain weight after they quit smoking. However, not everyone does. To keep from gaining weight, have a plan in place before you quit, and stick to the plan after you quit. Your plan should include: Eating healthy snacks. When you have a craving, it may help to: Eat popcorn, or try carrots, celery, or other cut vegetables. Chew sugar-free gum. Changing how you eat. Eat small portion sizes at meals. Eat 4-6 small meals throughout the day instead of 1-2 large meals a day. Be mindful when you eat. You should avoid watching television or doing other things that might distract you as  you eat. Exercising regularly. Make time to exercise each day. If you do not have time for a long workout, do short bouts of exercise for 5-10 minutes several times a day. Do some form of strengthening exercise, such as weight lifting. Do some exercise that gets your heart beating and causes you to breathe deeply, such as walking fast, running, swimming, or biking. This is very important. Drinking plenty of water or other low-calorie or no-calorie drinks. Drink enough fluid to keep your urine pale yellow.  How to recognize withdrawal symptoms Your body and mind may experience discomfort as you try to get used to not having nicotine in your system. These effects are called withdrawal symptoms. They may include: Feeling hungrier than normal. Having trouble concentrating. Feeling irritable or restless. Having trouble sleeping. Feeling depressed. Craving a cigarette. These symptoms may surprise you, but they are normal to have when quitting smoking. To manage withdrawal symptoms: Avoid places, people, and activities that trigger your cravings. Remember why you want to quit. Get plenty of sleep. Avoid coffee and other drinks that contain caffeine. These may worsen some of your symptoms. How to manage cravings Come up with a plan for how to deal with your cravings. The plan should include the following: A definition of the specific situation you want to deal with. An activity or action you will take to replace smoking. A clear idea for how this action will help. The name of someone who could help you with this. Cravings usually last for 5-10 minutes. Consider taking the following actions to help you with your plan to deal with cravings: Keep your mouth busy. Chew sugar-free gum. Suck on hard candies or a straw. Brush your teeth. Keep your hands and body busy. Change to a different activity right away. Squeeze or play with a ball. Do an activity or a hobby, such as making bead jewelry,  practicing needlepoint, or working with wood. Mix up your normal routine. Take a short exercise break. Go for a quick walk, or run up and down stairs. Focus on doing something kind or helpful for someone else. Call a friend or family member to talk during a craving. Join a support group. Contact a quitline. Where to find support To get help or find a support group: Call the National Cancer Institute's Smoking Quitline: 1-800-QUIT-NOW 260-528-3472) Text QUIT to SmokefreeTXT: 454098 Where to find more information Visit these websites to find more information on quitting smoking: [place] Department of Health and Human Services: www.smokefree.gov American Lung Association: www.freedomfromsmoking.org Centers for Disease Control and Prevention (CDC): FootballExhibition.com.br American Heart Association: www.heart.org Contact a health care provider if: You want to change your plan for quitting. The medicines you are taking are not helping. Your eating feels out  of control or you cannot sleep. You feel depressed or become very anxious. Summary Quitting smoking is a physical and mental challenge. You will face cravings, withdrawal symptoms, and temptation to smoke again. Preparation can help you as you go through these challenges. Try different techniques to manage stress, handle social situations, and prevent weight gain. You can deal with cravings by keeping your mouth busy (such as by chewing gum), keeping your hands and body busy, calling family or friends, or contacting a quitline for people who want to quit smoking. You can deal with withdrawal symptoms by avoiding places where people smoke, getting plenty of rest, and avoiding drinks that contain caffeine. This information is not intended to replace advice given to you by your health care provider. Make sure you discuss any questions you have with your health care provider. Document Revised: 01/11/2021 Document Reviewed: 01/11/2021 Elsevier Patient Education   2024 [employer].

## 2022-08-15 NOTE — Assessment & Plan Note (Signed)
Counseling on health consequences of obesity. Check labs to look for complications.  Counseling on diet and exercise.  Screen for sleep apnea

## 2022-08-15 NOTE — Assessment & Plan Note (Signed)
Order HST

## 2022-08-20 LAB — ALDOSTERONE + RENIN ACTIVITY W/ RATIO
ALDO / PRA Ratio: 1.6 Ratio (ref 0.9–28.9)
Aldosterone: 2 ng/dL
Renin Activity: 1.23 ng/mL/h (ref 0.25–5.82)

## 2022-09-03 ENCOUNTER — Encounter (INDEPENDENT_AMBULATORY_CARE_PROVIDER_SITE_OTHER): Payer: Self-pay

## 2022-09-17 ENCOUNTER — Encounter: Payer: Self-pay | Admitting: Family Medicine

## 2022-09-17 ENCOUNTER — Other Ambulatory Visit (HOSPITAL_BASED_OUTPATIENT_CLINIC_OR_DEPARTMENT_OTHER): Payer: Self-pay

## 2022-09-17 ENCOUNTER — Ambulatory Visit: Payer: Medicaid Other | Admitting: Family Medicine

## 2022-09-17 VITALS — BP 130/94 | HR 95 | Temp 97.8°F | Ht 69.0 in | Wt 294.0 lb

## 2022-09-17 DIAGNOSIS — I10 Essential (primary) hypertension: Secondary | ICD-10-CM | POA: Diagnosis not present

## 2022-09-17 DIAGNOSIS — Z9189 Other specified personal risk factors, not elsewhere classified: Secondary | ICD-10-CM | POA: Diagnosis not present

## 2022-09-17 MED ORDER — INDAPAMIDE 1.25 MG PO TABS
1.2500 mg | ORAL_TABLET | Freq: Every day | ORAL | 2 refills | Status: AC
Start: 2022-09-17 — End: ?
  Filled 2022-09-17: qty 30, 30d supply, fill #0

## 2022-09-17 NOTE — Progress Notes (Signed)
Subjective:     Patient ID: DION LATENDRESSE, male    DOB: 1985/09/10, 37 y.o.   MRN: [id number]  Chief Complaint  Patient presents with   Hypertension    1 month f/u, BP cuff just came yesterday. Has been trying better with diet    HPI  Discussed the use of AI scribe software for clinical note transcription with the patient, who gave verbal consent to proceed.  History of Present Illness          Here for a follow up on uncontrolled HTN. Started amlodipine 4 weeks ago.  He has not started checking his BP at home yet. New BP machine came and he will start.  Sleep study pended for scheduling, awaiting insurance approval.   He has cut back on cigarette smoking     Health Maintenance Due  Topic Date Due   INFLUENZA VACCINE  09/04/2022    Past Medical History:  Diagnosis Date   Environmental allergies    Migraine    Pain in left knee 10/15/2017   Pain in right knee 12/22/2017   Strain of calf muscle 10/15/2017    Past Surgical History:  Procedure Laterality Date   HERNIA REPAIR      History reviewed. No pertinent family history.  Social History   Socioeconomic History   Marital status: Significant Other    Spouse name: Not on file   Number of children: Not on file   Years of education: Not on file   Highest education level: Not on file  Occupational History   Not on file  Tobacco Use   Smoking status: Every Day    Current packs/day: 1.00    Types: Cigarettes   Smokeless tobacco: Never  Substance and Sexual Activity   Alcohol use: Yes    Comment: weekends   Drug use: Yes    Frequency: 7.0 times per week    Types: Marijuana    Comment: Mari[place]uana daily   Sexual activity: Yes    Partners: Female    Comment: accepted condoms  Other Topics Concern   Not on file  Social History Narrative   Not on file   Social Determinants of Health   Financial Resource Strain: Not on file  Food Insecurity: Not on file  Transportation Needs: Not on file   Physical Activity: Not on file  Stress: Not on file  Social Connections: Not on file  Intimate Partner Violence: Not on file    Outpatient Medications Prior to Visit  Medication Sig Dispense Refill   amLODipine (NORVASC) 5 MG tablet Take 1 tablet (5 mg total) by mouth daily. 30 tablet 1   bictegravir-emtricitabine-tenofovir AF (BIKTARVY) 50-200-25 MG TABS tablet TAKE 1 TABLET BY MOUTH DAILY 30 tablet 5   cetirizine (ZYRTEC) 10 MG tablet Take 10 mg by mouth daily.     clobetasol cream (TEMOVATE) 0.05 % Apply 1 application. topically 2 (two) times daily. 30 g 3   fluticasone (FLONASE) 50 MCG/ACT nasal spray Place 1 spray into both nostrils daily. 9.9 g 0   traZODone (DESYREL) 50 MG tablet TAKE 1 TABLET BY MOUTH EVERY NIGHT AT BEDTIME 30 tablet 2   hydrOXYzine (VISTARIL) 25 MG capsule Take 1 capsule (25 mg total) by mouth 3 (three) times daily as needed. (Patient not taking: Reported on 09/17/2022) 30 capsule 0   ibuprofen (ADVIL,MOTRIN) 800 MG tablet Take 1 tablet (800 mg total) by mouth every 8 (eight) hours as needed. (Patient not taking: Reported on 09/17/2022) 21  tablet 0   naproxen (NAPROSYN) 500 MG tablet Take 1 tablet (500 mg total) by mouth 2 (two) times daily. (Patient not taking: Reported on 09/17/2022) 30 tablet 0   No facility-administered medications prior to visit.    No Known Allergies  Review of Systems  Constitutional:  Negative for chills and fever.  Respiratory:  Negative for shortness of breath.   Cardiovascular:  Negative for chest pain, palpitations and leg swelling.  Gastrointestinal:  Negative for abdominal pain, constipation, diarrhea, nausea and vomiting.  Genitourinary:  Negative for dysuria, frequency and urgency.  Neurological:  Negative for dizziness.       Objective:    Physical Exam Constitutional:      General: He is not in acute distress.    Appearance: He is not ill-appearing.  Eyes:     Extraocular Movements: Extraocular movements intact.      Conjunctiva/sclera: Conjunctivae normal.  Cardiovascular:     Rate and Rhythm: Normal rate.  Pulmonary:     Effort: Pulmonary effort is normal.  Musculoskeletal:     Cervical back: Normal range of motion.  Skin:    General: Skin is warm and dry.  Neurological:     General: No focal deficit present.     Mental Status: He is alert and oriented to person, place, and time.  Psychiatric:        Mood and Affect: Mood normal.        Behavior: Behavior normal.        Thought Content: Thought content normal.      BP (!) 130/94 (BP Location: Left Arm, Patient Position: Sitting, Cuff Size: Large)   Pulse 95   Temp 97.8 F (36.6 C) (Temporal)   Ht 5\' 9"  (1.753 m)   Wt 294 lb (133.4 kg)   SpO2 97%   BMI 43.42 kg/m  Wt Readings from Last 3 Encounters:  09/17/22 294 lb (133.4 kg)  08/15/22 298 lb (135.2 kg)  05/15/22 296 lb 12.8 oz (134.6 kg)       Assessment & Plan:   Problem List Items Addressed This Visit       Cardiovascular and Mediastinum   Uncontrolled hypertension - Primary   Relevant Medications   indapamide (LOZOL) 1.25 MG tablet     Other   At risk for sleep apnea   Continue amlodipine.  Add indapamide 1.25 mg daily.  He will start checking his blood pressure at home.  Continue low-sodium diet.  Continue working on stopping smoking.  Hopefully he will hear from the sleep center once his HST has been approved.  He will follow-up in 4 to 6 weeks with his blood pressure machine and readings.  Recheck BMP at that time.   I have discontinued Gasper Lloyd. Fonte's ibuprofen, naproxen, and hydrOXYzine. I am also having him start on indapamide. Additionally, I am having him maintain his cetirizine, fluticasone, clobetasol cream, traZODone, Biktarvy, and amLODipine.  Meds ordered this encounter  Medications   indapamide (LOZOL) 1.25 MG tablet    Sig: Take 1 tablet (1.25 mg total) by mouth daily.    Dispense:  30 tablet    Refill:  2    Order Specific Question:    Supervising Provider    Answer:   Hillard Danker A [[id number]]

## 2022-09-17 NOTE — Patient Instructions (Addendum)
Continue amlodipine and start the new medication indapamide once daily.  Start checking your blood pressure at home.  Bring in your blood pressure machine and readings and follow-up with me in 4 to 6 weeks please.

## 2022-09-18 ENCOUNTER — Encounter (INDEPENDENT_AMBULATORY_CARE_PROVIDER_SITE_OTHER): Payer: Self-pay

## 2022-09-18 ENCOUNTER — Telehealth: Payer: Self-pay | Admitting: Family Medicine

## 2022-09-18 ENCOUNTER — Other Ambulatory Visit (HOSPITAL_BASED_OUTPATIENT_CLINIC_OR_DEPARTMENT_OTHER): Payer: Self-pay

## 2022-09-18 DIAGNOSIS — I10 Essential (primary) hypertension: Secondary | ICD-10-CM

## 2022-09-18 MED ORDER — INDAPAMIDE 1.25 MG PO TABS
1.2500 mg | ORAL_TABLET | Freq: Every day | ORAL | 2 refills | Status: DC
Start: 2022-09-18 — End: 2022-12-05

## 2022-09-18 NOTE — Telephone Encounter (Signed)
Rx resent.

## 2022-09-18 NOTE — Telephone Encounter (Signed)
Pt meds was sent to the wrong pharmacy and would like for the meds that was prescribe to him yesterday to be resent to [facility] on [address] [place]  indapamide (LOZOL) 1.25 MG tablet       Sig: Take 1 tablet (1.25 mg total) by mouth daily.      Dispense:  30 tablet      Refill:  2      Order Specific Question:   Supervising Provider      Answer:

## 2022-09-19 ENCOUNTER — Other Ambulatory Visit (HOSPITAL_BASED_OUTPATIENT_CLINIC_OR_DEPARTMENT_OTHER): Payer: Self-pay

## 2022-09-19 ENCOUNTER — Telehealth: Payer: Self-pay | Admitting: Family Medicine

## 2022-09-19 NOTE — Telephone Encounter (Signed)
Pt want all his medication sent to:  [id number] [place], [place] - [address] [place]  [address] Phone: [phone]  Fax: [phone]

## 2022-09-19 NOTE — Telephone Encounter (Signed)
Pharmacy added.

## 2022-10-08 ENCOUNTER — Other Ambulatory Visit: Payer: Self-pay | Admitting: Family Medicine

## 2022-10-08 DIAGNOSIS — I10 Essential (primary) hypertension: Secondary | ICD-10-CM

## 2022-10-17 ENCOUNTER — Encounter: Payer: Self-pay | Admitting: Pharmacist

## 2022-10-20 ENCOUNTER — Other Ambulatory Visit: Payer: Self-pay

## 2022-10-20 ENCOUNTER — Emergency Department (HOSPITAL_BASED_OUTPATIENT_CLINIC_OR_DEPARTMENT_OTHER)
Admission: EM | Admit: 2022-10-20 | Discharge: 2022-10-20 | Disposition: A | Discharge status: Home / Self Care | Payer: Medicaid Other | Attending: Emergency Medicine | Admitting: Emergency Medicine

## 2022-10-20 ENCOUNTER — Encounter (HOSPITAL_BASED_OUTPATIENT_CLINIC_OR_DEPARTMENT_OTHER): Payer: Self-pay

## 2022-10-20 DIAGNOSIS — Z202 Contact with and (suspected) exposure to infections with a predominantly sexual mode of transmission: Secondary | ICD-10-CM | POA: Diagnosis present

## 2022-10-20 DIAGNOSIS — Z79899 Other long term (current) drug therapy: Secondary | ICD-10-CM | POA: Insufficient documentation

## 2022-10-20 DIAGNOSIS — I10 Essential (primary) hypertension: Secondary | ICD-10-CM | POA: Diagnosis not present

## 2022-10-20 DIAGNOSIS — Z21 Asymptomatic human immunodeficiency virus [HIV] infection status: Secondary | ICD-10-CM | POA: Insufficient documentation

## 2022-10-20 HISTORY — DX: Essential (primary) hypertension: I10

## 2022-10-20 HISTORY — DX: Asymptomatic human immunodeficiency virus (hiv) infection status: Z21

## 2022-10-20 HISTORY — DX: Human immunodeficiency virus (HIV) disease: B20

## 2022-10-20 LAB — URINALYSIS, ROUTINE W REFLEX MICROSCOPIC
Bilirubin Urine: NEGATIVE
Glucose, UA: NEGATIVE mg/dL
Hgb urine dipstick: NEGATIVE
Ketones, ur: NEGATIVE mg/dL
Leukocytes,Ua: NEGATIVE
Nitrite: NEGATIVE
Specific Gravity, Urine: 1.026 (ref 1.005–1.030)
pH: 7 (ref 5.0–8.0)

## 2022-10-20 MED ORDER — METRONIDAZOLE 500 MG PO TABS: 2000.0000 mg | ORAL_TABLET | Freq: Once | ORAL | 0 refills | Status: AC

## 2022-10-20 NOTE — Discharge Instructions (Addendum)
As discussed, we will send a medication called Flagyl for treatment of trichomonas.  Recommend follow-up with primary care.  Please do not hesitate to return to emergency department for worrisome signs and symptoms we discussed become apparent.

## 2022-10-20 NOTE — ED Provider Notes (Signed)
Keith Blackwell Provider Note   CSN: [id number] Arrival date & time: 10/20/22  1745     History  Chief Complaint  Patient presents with   Exposure to STD    Keith Blackwell is a 37 y.o. male.   Exposure to STD   37 year old male presents emergency department with complaints of exposure to STD.  Patient states that his sexual partner just tested positive for trichomonas today but tested negative for gonorrhea, chlamydia.  Patient requesting treatment for trichomonas.  Denies any fever, abdominal pain, nausea, vomiting, dysuria, hematuria, penile discharge.  States that he feels at his baseline.  Past medical history significant for HIV, hypertension, migraine  Home Medications Prior to Admission medications   Medication Sig Start Date End Date Taking? Authorizing Provider  metroNIDAZOLE (FLAGYL) 500 MG tablet Take 4 tablets (2,000 mg total) by mouth once for 1 dose. 10/20/22 10/20/22 Yes Sherian Maroon A, PA  amLODipine (NORVASC) 5 MG tablet TAKE 1 TABLET (5 MG TOTAL) BY MOUTH DAILY. 10/08/22   Henson, Vickie L, NP-C  bictegravir-emtricitabine-tenofovir AF (BIKTARVY) 50-200-25 MG TABS tablet TAKE 1 TABLET BY MOUTH DAILY 05/26/22   Vu, Trung T, MD  cetirizine (ZYRTEC) 10 MG tablet Take 10 mg by mouth daily.    [provider]  clobetasol cream (TEMOVATE) 0.05 % Apply 1 application. topically 2 (two) times daily. 05/30/21   Vu, Tonita Phoenix, MD  fluticasone (FLONASE) 50 MCG/ACT nasal spray Place 1 spray into both nostrils daily. 10/31/15   Bethel Born, PA-C  indapamide (LOZOL) 1.25 MG tablet Take 1 tablet (1.25 mg total) by mouth daily. 09/18/22   Henson, Vickie L, NP-C  traZODone (DESYREL) 50 MG tablet TAKE 1 TABLET BY MOUTH EVERY NIGHT AT BEDTIME 05/01/22   Vu, Tonita Phoenix, MD      Allergies    Patient has no known allergies.    Review of Systems   Review of Systems  All other systems reviewed and are negative.   Physical  Exam Updated Vital Signs BP (!) 152/101   Pulse (!) 105   Temp 98.7 F (37.1 C)   Resp 16   Ht 5\' 10"  (1.778 m)   Wt 131.5 kg   SpO2 99%   BMI 41.61 kg/m  Physical Exam Vitals and nursing note reviewed.  Constitutional:      General: He is not in acute distress.    Appearance: He is well-developed.  HENT:     Head: Normocephalic and atraumatic.  Eyes:     Conjunctiva/sclera: Conjunctivae normal.  Cardiovascular:     Rate and Rhythm: Normal rate and regular rhythm.     Heart sounds: No murmur heard. Pulmonary:     Effort: Pulmonary effort is normal. No respiratory distress.     Breath sounds: Normal breath sounds.  Abdominal:     Palpations: Abdomen is soft.     Tenderness: There is no abdominal tenderness.  Musculoskeletal:        General: No swelling.     Cervical back: Neck supple.  Skin:    General: Skin is warm and dry.     Capillary Refill: Capillary refill takes less than 2 seconds.  Neurological:     Mental Status: He is alert.  Psychiatric:        Mood and Affect: Mood normal.     ED Results / Procedures / Treatments   Labs (all labs ordered are listed, but only abnormal results are displayed) Labs Reviewed  URINALYSIS, ROUTINE W REFLEX MICROSCOPIC - Abnormal; Notable for the following components:      Result Value   Protein, ur TRACE (*)    All other components within normal limits    EKG None  Radiology No results found.  Procedures Procedures    Medications Ordered in ED Medications - No data to display  ED Course/ Medical Decision Making/ A&P                                 Medical Decision Making Amount and/or Complexity of Data Reviewed Labs: ordered.  Risk Prescription drug management.   This patient presents to the ED for concern of STD exposure, this involves an extensive number of treatment options, and is a complaint that carries with it a high risk of complications and morbidity.  The differential diagnosis includes  gonorrhea, chlamydia, trichomonas, HIV   Co morbidities that complicate the patient evaluation  See HPI   Additional history obtained:  Additional history obtained from EMR External records from outside source obtained and reviewed including hospital records   Lab Tests:  I Ordered, and personally interpreted labs.  The pertinent results include: UA significant for protein but otherwise unremarkable.   Imaging Studies ordered:  N/a   Cardiac Monitoring: / EKG:  The patient was maintained on a cardiac monitor.  I personally viewed and interpreted the cardiac monitored which showed an underlying rhythm of: Sinus rhythm   Consultations Obtained:  N/a   Problem List / ED Course / Critical interventions / Medication management  STD exposure Reevaluation of the patient showed that the patient stayed the same I have reviewed the patients home medicines and have made adjustments as needed   Social Determinants of Health:  Chronic cigarette use.  Denies illicit drug use.   Test / Admission - Considered:  STD exposure Vitals signs significant for hypertension blood pressure 152 systolic. Otherwise within normal range and stable throughout visit. Laboratory studies significant for: See above 69 old male presents emergency department after exposure to trichomonas.  Per patient, partner tested negative for "all other STDs."  Patient declines any testing himself for STDs but "just wants to be treated."  Will treat for presumed trichomonas exposure and have patient follow-up with primary care for reevaluation.  Treatment plan discussed at length with patient and he acknowledged understanding was agreeable to said plan.  Patient overall well-appearing, afebrile in no acute distress. Worrisome signs and symptoms were discussed with the patient, and the patient acknowledged understanding to return to the ED if noticed. Patient was stable upon discharge.          Final  Clinical Impression(s) / ED Diagnoses Final diagnoses:  STD exposure    Rx / DC Orders ED Discharge Orders          Ordered    metroNIDAZOLE (FLAGYL) 500 MG tablet   Once        10/20/22 1937              Peter Garter, PA 10/20/22 1948    Melene Plan, DO 10/20/22 1959

## 2022-10-20 NOTE — ED Triage Notes (Signed)
Pt presents with STD check after he was told he was exposed to trichomonas. Denies symptoms.

## 2022-10-22 ENCOUNTER — Ambulatory Visit: Payer: Medicaid Other | Admitting: Family Medicine

## 2022-10-24 ENCOUNTER — Encounter: Payer: Self-pay | Admitting: Family Medicine

## 2022-10-24 ENCOUNTER — Ambulatory Visit: Payer: Medicaid Other | Admitting: Family Medicine

## 2022-10-24 VITALS — BP 144/92 | HR 95 | Temp 97.8°F | Ht 70.0 in | Wt 294.0 lb

## 2022-10-24 DIAGNOSIS — Z9189 Other specified personal risk factors, not elsewhere classified: Secondary | ICD-10-CM

## 2022-10-24 DIAGNOSIS — M25561 Pain in right knee: Secondary | ICD-10-CM

## 2022-10-24 DIAGNOSIS — R0683 Snoring: Secondary | ICD-10-CM

## 2022-10-24 DIAGNOSIS — R400 Somnolence: Secondary | ICD-10-CM

## 2022-10-24 DIAGNOSIS — I10 Essential (primary) hypertension: Secondary | ICD-10-CM | POA: Diagnosis not present

## 2022-10-24 MED ORDER — AMLODIPINE BESYLATE 10 MG PO TABS
10.0000 mg | ORAL_TABLET | Freq: Every day | ORAL | 0 refills | Status: DC
Start: 2022-10-24 — End: 2022-12-05

## 2022-10-24 NOTE — Patient Instructions (Signed)
Please go downstairs for labs before you leave.  I increased your amlodipine from 5 mg to 10 mg.  Continue taking indapamide (fluid pill) but take it in the morning.  Continue monitoring your blood pressure at home.  You should hear from [facility] regarding your sleep study.  Follow-up here in 6 weeks.

## 2022-10-24 NOTE — Progress Notes (Signed)
Subjective:     Patient ID: Keith Blackwell, male    DOB: 1985-05-27, 37 y.o.   MRN: [id number]  Chief Complaint  Patient presents with   Hypertension    4-6 week f/u, BP has been ranging 150/80-130/50    Hypertension Pertinent negatives include no chest pain, headaches, palpitations or shortness of breath.    Discussed the use of AI scribe software for clinical note transcription with the patient, who gave verbal consent to proceed.  History of Present Illness         Keith Blackwell is here for follow-up on uncontrolled hypertension.  Reports missing some doses of indapamide due to getting up at night to urinate.  States Keith Blackwell was taking the medicine in the evening.  Keith Blackwell takes amlodipine 5 mg in the morning. Blood pressures at home have still been elevated.   Keith Blackwell has a new concern regarding right knee pain and swelling.  States symptoms started while walking on the treadmill.  History of knee pain and injury approximately 11 years ago.  Denies having surgery on his knee in the past.  Denies locking, popping or giving away.  No numbness, tingling or weakness. Keith Blackwell is taking ibuprofen.    Health Maintenance Due  Topic Date Due   COVID-19 Vaccine (1) Never done   INFLUENZA VACCINE  Never done    Past Medical History:  Diagnosis Date   Environmental allergies    HIV (human immunodeficiency virus infection) (HCC)    HTN (hypertension)    Migraine    Pain in left knee 10/15/2017   Pain in right knee 12/22/2017   Strain of calf muscle 10/15/2017    Past Surgical History:  Procedure Laterality Date   HERNIA REPAIR      History reviewed. No pertinent family history.  Social History   Socioeconomic History   Marital status: Single    Spouse name: Not on file   Number of children: Not on file   Years of education: Not on file   Highest education level: Not on file  Occupational History   Not on file  Tobacco Use   Smoking status: Every Day    Current packs/day: 1.00    Types:  Cigarettes   Smokeless tobacco: Never  Vaping Use   Vaping status: Never Used  Substance and Sexual Activity   Alcohol use: Yes    Comment: weekends   Drug use: Yes    Frequency: 7.0 times per week    Types: Marijuana    Comment: [place] daily   Sexual activity: Yes    Partners: Female    Comment: accepted condoms  Other Topics Concern   Not on file  Social History Narrative   Not on file   Social Determinants of Health   Financial Resource Strain: Not on file  Food Insecurity: Not on file  Transportation Needs: Not on file  Physical Activity: Not on file  Stress: Not on file  Social Connections: Not on file  Intimate Partner Violence: Not on file    Outpatient Medications Prior to Visit  Medication Sig Dispense Refill   bictegravir-emtricitabine-tenofovir AF (BIKTARVY) 50-200-25 MG TABS tablet TAKE 1 TABLET BY MOUTH DAILY 30 tablet 5   cetirizine (ZYRTEC) 10 MG tablet Take 10 mg by mouth daily.     clobetasol cream (TEMOVATE) 0.05 % Apply 1 application. topically 2 (two) times daily. 30 g 3   fluticasone (FLONASE) 50 MCG/ACT nasal spray Place 1 spray into both nostrils daily. 9.9 g 0  indapamide (LOZOL) 1.25 MG tablet Take 1 tablet (1.25 mg total) by mouth daily. 30 tablet 2   traZODone (DESYREL) 50 MG tablet TAKE 1 TABLET BY MOUTH EVERY NIGHT AT BEDTIME 30 tablet 2   amLODipine (NORVASC) 5 MG tablet TAKE 1 TABLET (5 MG TOTAL) BY MOUTH DAILY. 30 tablet 1   No facility-administered medications prior to visit.    No Known Allergies  Review of Systems  Constitutional:  Negative for chills and fever.  Respiratory:  Negative for shortness of breath.   Cardiovascular:  Negative for chest pain, palpitations and leg swelling.  Gastrointestinal:  Negative for abdominal pain, constipation, diarrhea, nausea and vomiting.  Genitourinary:  Negative for dysuria, frequency and urgency.  Musculoskeletal:  Positive for joint pain.  Neurological:  Negative for dizziness, focal  weakness and headaches.       Objective:    Physical Exam Constitutional:      General: Keith Blackwell is not in acute distress.    Appearance: Keith Blackwell is not ill-appearing.  Eyes:     Extraocular Movements: Extraocular movements intact.     Conjunctiva/sclera: Conjunctivae normal.  Cardiovascular:     Rate and Rhythm: Normal rate.  Pulmonary:     Effort: Pulmonary effort is normal.  Musculoskeletal:     Cervical back: Normal range of motion and neck supple.     Right knee: Swelling present. No erythema. Normal range of motion. Tenderness present over the medial joint line.     Right lower leg: No edema.     Left lower leg: No edema.  Skin:    General: Skin is warm and dry.  Neurological:     General: No focal deficit present.     Mental Status: Keith Blackwell is alert and oriented to person, place, and time.     Motor: No weakness.     Coordination: Coordination normal.     Gait: Gait abnormal.  Psychiatric:        Mood and Affect: Mood normal.        Behavior: Behavior normal.        Thought Content: Thought content normal.      BP (!) 144/92 (BP Location: Left Arm, Patient Position: Sitting, Cuff Size: Large)   Pulse 95   Temp 97.8 F (36.6 C) (Temporal)   Ht 5\' 10"  (1.778 m)   Wt 294 lb (133.4 kg)   SpO2 98%   BMI 42.18 kg/m  Wt Readings from Last 3 Encounters:  10/24/22 294 lb (133.4 kg)  10/20/22 290 lb (131.5 kg)  09/17/22 294 lb (133.4 kg)       Assessment & Plan:   Problem List Items Addressed This Visit       Cardiovascular and Mediastinum   Uncontrolled hypertension - Primary   Relevant Medications   amLODipine (NORVASC) 10 MG tablet   Other Relevant Orders   PSG SLEEP STUDY   Basic metabolic panel     Other   Acute pain of right knee   Relevant Orders   Ambulatory referral to Sports Medicine   At risk for sleep apnea   Relevant Orders   PSG SLEEP STUDY   Daytime somnolence   Relevant Orders   PSG SLEEP STUDY   Snores   Relevant Orders   PSG SLEEP STUDY     I have discontinued Gasper Lloyd. Crouse's amLODipine. I am also having him start on amLODipine. Additionally, I am having him maintain his cetirizine, fluticasone, clobetasol cream, traZODone, Biktarvy, and indapamide.  Meds ordered this  encounter  Medications   amLODipine (NORVASC) 10 MG tablet    Sig: Take 1 tablet (10 mg total) by mouth daily.    Dispense:  90 tablet    Refill:  0    Order Specific Question:   Supervising Provider    Answer:   Hillard Danker A [[id number]]

## 2022-10-26 NOTE — Assessment & Plan Note (Signed)
History of same. No new injury. Recently started exercising more resulting in pain and swelling. Continue RICE therapy for now.  Referral to sports medicine for further evaluation and treatment.

## 2022-10-26 NOTE — Assessment & Plan Note (Signed)
In depth counseling on HTN, health risks and how to control by reducing modifiable risk factors and medications.   Seems likely that he has sleep apnea. HST was never completed. New sleep test through [facility] ordered.  Discussed adding valsartan and he declines adding a medication.  Increase amlodipine to 10 mg daily. Continue indapamide but advised him to take this in the AM.  He is concerned and declines any medications that could interfere with sexual function.  Monitor BP at home.  DASH diet handout provided.  Follow up in 4-6 weeks.

## 2022-10-28 ENCOUNTER — Ambulatory Visit (INDEPENDENT_AMBULATORY_CARE_PROVIDER_SITE_OTHER): Payer: Medicaid Other | Admitting: Sports Medicine

## 2022-10-28 ENCOUNTER — Ambulatory Visit (INDEPENDENT_AMBULATORY_CARE_PROVIDER_SITE_OTHER): Payer: Medicaid Other

## 2022-10-28 VITALS — BP 132/82 | HR 72 | Ht 70.0 in | Wt 294.0 lb

## 2022-10-28 DIAGNOSIS — M25561 Pain in right knee: Secondary | ICD-10-CM

## 2022-10-28 DIAGNOSIS — G8929 Other chronic pain: Secondary | ICD-10-CM | POA: Diagnosis not present

## 2022-10-28 DIAGNOSIS — M1711 Unilateral primary osteoarthritis, right knee: Secondary | ICD-10-CM | POA: Diagnosis not present

## 2022-10-28 NOTE — Progress Notes (Signed)
Keith Blackwell [facility] [address] [place] [address] Phone: [phone]   Assessment and Plan:     1. Chronic pain of right knee 2. Primary osteoarthritis of right knee  -Chronic with exacerbation, initial sports medicine visit - Most consistent with flare of osteoarthritis based on HPI and physical exam - X-ray obtained in clinic.  My interpretation: Decreased joint space and degenerative changes primarily medial compartment and mild in patellofemoral compartment. - Patient has received minimal relief with ibuprofen 800 mg 1-2 times a day, so I do not believe additional NSAIDs would sufficiently decrease inflammation - Patient elected for intra-articular CSI.  Tolerated well per note below - May continue ibuprofen/Tylenol as needed for day-to-day pain relief - Start HEP for knee  Procedure: Knee Joint Injection Side: Right Indication: Flare of osteoarthritis  Risks explained and consent was given verbally. The site was cleaned with alcohol prep. A needle was introduced with an anterio-lateral approach. Injection given using 2mL of 1% lidocaine without epinephrine and 1mL of kenalog 40mg /ml. This was well tolerated and resulted in symptomatic relief.  Needle was removed, hemostasis achieved, and post injection instructions were explained.   Pt was advised to call or return to clinic if these symptoms worsen or fail to improve as anticipated.   15 additional minutes spent for educating Therapeutic Home Exercise Program.  This included exercises focusing on stretching, strengthening, with focus on eccentric aspects.   Long term goals include an improvement in range of motion, strength, endurance as well as avoiding reinjury. Patient's frequency would include in 1-2 times a day, 3-5 times a week for a duration of 6-12 weeks. Proper technique shown and discussed handout in great detail with ATC.  All questions were discussed and answered.     Pertinent previous records reviewed include none   Follow Up: 5 to 6 weeks for reevaluation.  If no improvement or worsening of symptoms, could consider alternative injections versus MRI   Subjective:   I, Keith Blackwell, am serving as a Neurosurgeon for Doctor Keith Blackwell  Chief Complaint: right knee pain   HPI:   10/28/22 Patient is a 37 year old male complaining of right knee pain. Patient states that he was walking on the treadmill, about 2 weeks ago. Ibu for the pain and that does not help. Pain radiates up thele and medial and lateral aspect pain. He isnt able to sleep through the night. Pain when driving. Numbness and tingling.   Relevant Historical Information:  HIV, hypertension  Additional pertinent review of systems negative.   Current Outpatient Medications:    amLODipine (NORVASC) 10 MG tablet, Take 1 tablet (10 mg total) by mouth daily., Disp: 90 tablet, Rfl: 0   bictegravir-emtricitabine-tenofovir AF (BIKTARVY) 50-200-25 MG TABS tablet, TAKE 1 TABLET BY MOUTH DAILY, Disp: 30 tablet, Rfl: 5   cetirizine (ZYRTEC) 10 MG tablet, Take 10 mg by mouth daily., Disp: , Rfl:    clobetasol cream (TEMOVATE) 0.05 %, Apply 1 application. topically 2 (two) times daily., Disp: 30 g, Rfl: 3   fluticasone (FLONASE) 50 MCG/ACT nasal spray, Place 1 spray into both nostrils daily., Disp: 9.9 g, Rfl: 0   indapamide (LOZOL) 1.25 MG tablet, Take 1 tablet (1.25 mg total) by mouth daily., Disp: 30 tablet, Rfl: 2   traZODone (DESYREL) 50 MG tablet, TAKE 1 TABLET BY MOUTH EVERY NIGHT AT BEDTIME, Disp: 30 tablet, Rfl: 2   Objective:     Vitals:   10/28/22 1000  BP: 132/82  Pulse: 72  SpO2: 96%  Weight: 294 lb (133.4 kg)  Height: 5\' 10"  (1.778 m)      Body mass index is 42.18 kg/m.    Physical Exam:    General:  awake, alert oriented, no acute distress nontoxic Skin: no suspicious lesions or rashes Neuro:sensation intact and strength 5/5 with no deficits, no atrophy, normal muscle  tone Psych: No signs of anxiety, depression or other mood disorder  Right knee: Mild swelling No deformity Positive fluid wave, joint milking ROM Flex 100, Ext 15 Mild TTP globally, nonspecific Neg anterior and posterior drawer Neg lachman Neg sag sign Negative varus stress Negative valgus stress Negative McMurray for palpable pop, though reproduced pain    Gait antalgic, favoring left leg   Electronically signed by:  Keith Blackwell [facility] 10:21 AM 10/28/22

## 2022-10-28 NOTE — Patient Instructions (Addendum)
Knee HEP  Tylenol and ibuprofen as needed for pain relief Relative rest for 1 week then gradual re-introduction  5-6 week follow up

## 2022-11-11 ENCOUNTER — Ambulatory Visit: Payer: Medicaid Other | Admitting: Internal Medicine

## 2022-11-14 ENCOUNTER — Other Ambulatory Visit: Payer: Self-pay | Admitting: Internal Medicine

## 2022-11-14 DIAGNOSIS — B20 Human immunodeficiency virus [HIV] disease: Secondary | ICD-10-CM

## 2022-11-18 NOTE — Progress Notes (Unsigned)
    Keith Blackwell D.[facility] [address] [place] [address] Phone: [phone]   Assessment and Plan:     There are no diagnoses linked to this encounter.  ***   Pertinent previous records reviewed include ***   Follow Up: ***     Subjective:   I, Keith Blackwell, am serving as a Neurosurgeon for Doctor [name]   Chief Complaint: right knee pain    HPI:    10/28/22 Patient is a 37 year old male complaining of right knee pain. Patient states that he was walking on the treadmill, about 2 weeks ago. Ibu for the pain and that does not help. Pain radiates up thele and medial and lateral aspect pain. He isnt able to sleep through the night. Pain when driving. Numbness and tingling.   12/02/2022 Patient states   Relevant Historical Information:  HIV, hypertension  Additional pertinent review of systems negative.   Current Outpatient Medications:    amLODipine (NORVASC) 10 MG tablet, Take 1 tablet (10 mg total) by mouth daily., Disp: 90 tablet, Rfl: 0   BIKTARVY 50-200-25 MG TABS tablet, TAKE 1 TABLET BY MOUTH DAILY, Disp: 30 tablet, Rfl: 0   cetirizine (ZYRTEC) 10 MG tablet, Take 10 mg by mouth daily., Disp: , Rfl:    clobetasol cream (TEMOVATE) 0.05 %, Apply 1 application. topically 2 (two) times daily., Disp: 30 g, Rfl: 3   fluticasone (FLONASE) 50 MCG/ACT nasal spray, Place 1 spray into both nostrils daily., Disp: 9.9 g, Rfl: 0   indapamide (LOZOL) 1.25 MG tablet, Take 1 tablet (1.25 mg total) by mouth daily., Disp: 30 tablet, Rfl: 2   traZODone (DESYREL) 50 MG tablet, TAKE 1 TABLET BY MOUTH EVERY NIGHT AT BEDTIME, Disp: 30 tablet, Rfl: 2   Objective:     There were no vitals filed for this visit.    There is no height or weight on file to calculate BMI.    Physical Exam:    ***   Electronically signed by:  Keith Blackwell D.[facility] 8:45 AM 11/18/22

## 2022-12-02 ENCOUNTER — Ambulatory Visit (INDEPENDENT_AMBULATORY_CARE_PROVIDER_SITE_OTHER): Payer: Medicaid Other | Admitting: Sports Medicine

## 2022-12-02 VITALS — HR 82 | Ht 70.0 in | Wt 293.0 lb

## 2022-12-02 DIAGNOSIS — G8929 Other chronic pain: Secondary | ICD-10-CM | POA: Diagnosis not present

## 2022-12-02 DIAGNOSIS — M1711 Unilateral primary osteoarthritis, right knee: Secondary | ICD-10-CM | POA: Diagnosis not present

## 2022-12-02 DIAGNOSIS — M25561 Pain in right knee: Secondary | ICD-10-CM | POA: Diagnosis not present

## 2022-12-02 DIAGNOSIS — M25562 Pain in left knee: Secondary | ICD-10-CM

## 2022-12-05 ENCOUNTER — Ambulatory Visit: Payer: Medicaid Other | Admitting: Family Medicine

## 2022-12-05 ENCOUNTER — Encounter: Payer: Self-pay | Admitting: Family Medicine

## 2022-12-05 VITALS — BP 132/84 | HR 99 | Temp 97.6°F | Ht 70.0 in | Wt 292.0 lb

## 2022-12-05 DIAGNOSIS — E781 Pure hyperglyceridemia: Secondary | ICD-10-CM

## 2022-12-05 DIAGNOSIS — Z9189 Other specified personal risk factors, not elsewhere classified: Secondary | ICD-10-CM

## 2022-12-05 DIAGNOSIS — I10 Essential (primary) hypertension: Secondary | ICD-10-CM | POA: Diagnosis not present

## 2022-12-05 DIAGNOSIS — R400 Somnolence: Secondary | ICD-10-CM

## 2022-12-05 LAB — BASIC METABOLIC PANEL
BUN: 15 mg/dL (ref 6–23)
CO2: 30 meq/L (ref 19–32)
Calcium: 9.5 mg/dL (ref 8.4–10.5)
Chloride: 101 meq/L (ref 96–112)
Creatinine, Ser: 1.39 mg/dL (ref 0.40–1.50)
GFR: 65.15 mL/min (ref >60.00)
Glucose, Bld: 108 mg/dL — ABNORMAL HIGH (ref 70–99)
Potassium: 3.8 meq/L (ref 3.5–5.1)
Sodium: 138 meq/L (ref 135–145)

## 2022-12-05 MED ORDER — INDAPAMIDE 1.25 MG PO TABS
1.2500 mg | ORAL_TABLET | Freq: Every day | ORAL | 0 refills | Status: DC
Start: 2022-12-05 — End: 2023-03-16

## 2022-12-05 MED ORDER — AMLODIPINE BESYLATE 10 MG PO TABS
10.0000 mg | ORAL_TABLET | Freq: Every day | ORAL | 0 refills | Status: DC
Start: 2022-12-05 — End: 2023-05-04

## 2022-12-05 NOTE — Assessment & Plan Note (Signed)
Counseling on low-fat diet and limiting sweets.  Follow-up fasting in 3 months.

## 2022-12-05 NOTE — Progress Notes (Signed)
Subjective:     Patient ID: Keith Blackwell, male    DOB: December 14, 1985, 37 y.o.   MRN: [id number]  Chief Complaint  Patient presents with   Hypertension    6 week f/u, ranging 130/80s sometimes 140s    Hypertension Associated symptoms include malaise/fatigue. Pertinent negatives include no chest pain, palpitations or shortness of breath.      History of Present Illness         He is here today to follow-up on chronic health conditions including uncontrolled hypertension.  Reports taking his medications daily.  Blood pressures have been in the 130s over 80s.  He is still having issues sleeping and feeling tired during the day.  Unfortunately he has not heard regarding his sleep study.  History of elevated triglycerides    There are no preventive care reminders to display for this patient.   Past Medical History:  Diagnosis Date   Environmental allergies    HIV (human immunodeficiency virus infection) (HCC)    HTN (hypertension)    Migraine    Pain in left knee 10/15/2017   Pain in right knee 12/22/2017   Strain of calf muscle 10/15/2017    Past Surgical History:  Procedure Laterality Date   HERNIA REPAIR      History reviewed. No pertinent family history.  Social History   Socioeconomic History   Marital status: Single    Spouse name: Not on file   Number of children: Not on file   Years of education: Not on file   Highest education level: Not on file  Occupational History   Not on file  Tobacco Use   Smoking status: Every Day    Current packs/day: 1.00    Types: Cigarettes   Smokeless tobacco: Never  Vaping Use   Vaping status: Never Used  Substance and Sexual Activity   Alcohol use: Yes    Comment: weekends   Drug use: Yes    Frequency: 7.0 times per week    Types: Marijuana    Comment: Marijiuana daily   Sexual activity: Yes    Partners: Female    Comment: accepted condoms  Other Topics Concern   Not on file  Social History Narrative    Not on file   Social Determinants of Health   Financial Resource Strain: Not on file  Food Insecurity: Not on file  Transportation Needs: Not on file  Physical Activity: Not on file  Stress: Not on file  Social Connections: Not on file  Intimate Partner Violence: Not on file    Outpatient Medications Prior to Visit  Medication Sig Dispense Refill   BIKTARVY 50-200-25 MG TABS tablet TAKE 1 TABLET BY MOUTH DAILY 30 tablet 0   cetirizine (ZYRTEC) 10 MG tablet Take 10 mg by mouth daily.     clobetasol cream (TEMOVATE) 0.05 % Apply 1 application. topically 2 (two) times daily. 30 g 3   fluticasone (FLONASE) 50 MCG/ACT nasal spray Place 1 spray into both nostrils daily. 9.9 g 0   traZODone (DESYREL) 50 MG tablet TAKE 1 TABLET BY MOUTH EVERY NIGHT AT BEDTIME 30 tablet 2   amLODipine (NORVASC) 10 MG tablet Take 1 tablet (10 mg total) by mouth daily. 90 tablet 0   indapamide (LOZOL) 1.25 MG tablet Take 1 tablet (1.25 mg total) by mouth daily. 30 tablet 2   No facility-administered medications prior to visit.    No Known Allergies  Review of Systems  Constitutional:  Positive for malaise/fatigue. Negative for  chills and fever.  Respiratory:  Negative for shortness of breath.   Cardiovascular:  Negative for chest pain, palpitations and leg swelling.  Gastrointestinal:  Negative for abdominal pain, constipation, diarrhea, nausea and vomiting.  Genitourinary:  Negative for dysuria, frequency and urgency.  Neurological:  Negative for dizziness.       Objective:    Physical Exam Constitutional:      General: He is not in acute distress.    Appearance: He is not ill-appearing.  Eyes:     Extraocular Movements: Extraocular movements intact.     Conjunctiva/sclera: Conjunctivae normal.  Cardiovascular:     Rate and Rhythm: Normal rate.  Pulmonary:     Effort: Pulmonary effort is normal.  Musculoskeletal:     Cervical back: Normal range of motion and neck supple.  Skin:    General:  Skin is warm and dry.  Neurological:     General: No focal deficit present.     Mental Status: He is alert and oriented to person, place, and time.  Psychiatric:        Mood and Affect: Mood normal.        Behavior: Behavior normal.        Thought Content: Thought content normal.      BP 132/84 (BP Location: Left Arm, Patient Position: Sitting, Cuff Size: Large)   Pulse 99   Temp 97.6 F (36.4 C) (Temporal)   Ht 5\' 10"  (1.778 m)   Wt 292 lb (132.5 kg)   SpO2 96%   BMI 41.90 kg/m  Wt Readings from Last 3 Encounters:  12/05/22 292 lb (132.5 kg)  12/02/22 293 lb (132.9 kg)  10/28/22 294 lb (133.4 kg)       Assessment & Plan:   Problem List Items Addressed This Visit       Cardiovascular and Mediastinum   Uncontrolled hypertension - Primary    Blood pressure better controlled.  Continue current medication regimen and monitoring at home.  Continue low-sodium diet.  Check BMP.  Follow-up in 3 months      Relevant Medications   amLODipine (NORVASC) 10 MG tablet   indapamide (LOZOL) 1.25 MG tablet     Other   At risk for sleep apnea   Daytime somnolence    Unfortunately he has not heard regarding his sleep study.  I will check into this      Hypertriglyceridemia    Counseling on low-fat diet and limiting sweets.  Follow-up fasting in 3 months.      Relevant Medications   amLODipine (NORVASC) 10 MG tablet   indapamide (LOZOL) 1.25 MG tablet     I am having Keith Blackwell. Haupert maintain his cetirizine, fluticasone, clobetasol cream, traZODone, Biktarvy, amLODipine, and indapamide.  Meds ordered this encounter  Medications   amLODipine (NORVASC) 10 MG tablet    Sig: Take 1 tablet (10 mg total) by mouth daily.    Dispense:  90 tablet    Refill:  0   indapamide (LOZOL) 1.25 MG tablet    Sig: Take 1 tablet (1.25 mg total) by mouth daily.    Dispense:  90 tablet    Refill:  0

## 2022-12-05 NOTE — Assessment & Plan Note (Signed)
Blood pressure better controlled.  Continue current medication regimen and monitoring at home.  Continue low-sodium diet.  Check BMP.  Follow-up in 3 months

## 2022-12-05 NOTE — Patient Instructions (Signed)
Continue your current medications.  Continue monitoring your blood pressure at home and report back if your readings are consistently higher than 130/80.  Limit foods high in fat, fried foods and sweets.  I would like to see you back in 3 months fasting (nothing to eat or drink except water for at least 8 hours) and we will recheck your triglycerides which were elevated a few months ago.

## 2022-12-05 NOTE — Assessment & Plan Note (Signed)
Unfortunately he has not heard regarding his sleep study.  I will check into this

## 2022-12-11 ENCOUNTER — Other Ambulatory Visit: Payer: Self-pay | Admitting: Internal Medicine

## 2022-12-11 DIAGNOSIS — B20 Human immunodeficiency virus [HIV] disease: Secondary | ICD-10-CM

## 2022-12-11 NOTE — Telephone Encounter (Signed)
APPT 1/18

## 2022-12-12 ENCOUNTER — Other Ambulatory Visit: Payer: Self-pay | Admitting: Family Medicine

## 2022-12-12 ENCOUNTER — Other Ambulatory Visit: Payer: Self-pay

## 2022-12-12 ENCOUNTER — Ambulatory Visit (INDEPENDENT_AMBULATORY_CARE_PROVIDER_SITE_OTHER): Payer: Medicaid Other | Admitting: Internal Medicine

## 2022-12-12 ENCOUNTER — Encounter: Payer: Self-pay | Admitting: Internal Medicine

## 2022-12-12 DIAGNOSIS — Z9189 Other specified personal risk factors, not elsewhere classified: Secondary | ICD-10-CM

## 2022-12-12 DIAGNOSIS — I10 Essential (primary) hypertension: Secondary | ICD-10-CM

## 2022-12-12 DIAGNOSIS — Z23 Encounter for immunization: Secondary | ICD-10-CM

## 2022-12-12 DIAGNOSIS — R0683 Snoring: Secondary | ICD-10-CM

## 2022-12-12 DIAGNOSIS — R400 Somnolence: Secondary | ICD-10-CM

## 2022-12-12 DIAGNOSIS — B20 Human immunodeficiency virus [HIV] disease: Secondary | ICD-10-CM | POA: Diagnosis present

## 2022-12-12 DIAGNOSIS — F419 Anxiety disorder, unspecified: Secondary | ICD-10-CM | POA: Diagnosis not present

## 2022-12-12 DIAGNOSIS — Z1159 Encounter for screening for other viral diseases: Secondary | ICD-10-CM

## 2022-12-12 NOTE — Progress Notes (Signed)
[facility]    Cc- hiv tx follow up   HPI: Keith Blackwell is a 37 y.o. male dx'ed hiv 2022, here for f/u routine care   Initial visit with me 05/2021: ------------ He came to the ED for an itchy rash on 4/10; given steroid cream. Tested for rpr and hiv as well  He was notified a day ago about the positive hiv test He has been with one male partner 3 years -- she is getting testing as of a day prior to this visit  He reports he noticed the rash in September 2022 (first time) -- intensely itch plaque/papular-squamous on in extensors side of bilateral elbows, around the belly button, and also popliteal fossa of the right knee. His grandmother does have psoriasis. No genital lesion   No sign/sx of recent viral illness within the 3 months prior to the hiv test. His last hiv testing was 2019 and was negative. He also have never had positive rpr in the past (no hx syphilis told)  Patient been feeling well otherwise without any abnormal sign/symptoms of concern outside of the rash (denies fever, chill, nightsweat, weightloss, decreased appetite, cough, chest pain, abd pain, n/v/diarrhea, penile discharge, lymph node swelling, headache, numbness/tingling, visual changes)  Social: -moved to [place], but grew up in [place] (born in [place]) -lives with grandmother -GED education level -not working right now -In a relationship of 3 years -- heterosexual relationship -marijuanna smoking; tobacco since age 53, currently about a ppd; no ivdu or other substance  We talked about the new merk study vs biktarvy -- he is interested and [place] with the research group will talk to him more this visit  He has never been on any hiv drug including PrEP  -------------------- 07/08/21 id f/u    His girlfriend tested 3-4 times ([facility]) and was negative. 3 years monogamous relationship   His last negative test was 08/2017  right before he knew his girlfriend  Takes biktarvy daily without missed dose  Feels depressed asked about antidepression. No SI/HI. Sleeping is terrible with regard to falling asleep since the diagnosis...  No other complaint   The rash he had previously is gone with topical steroid     08/13/21 id clinic f/u Patient compliant with biktarvy; no missed dose Still complaining of inability to fall asleep, and maintaining sleep He has been trying ambien and melatonin don't remember dose (got the latter otc) but they don't seem to help He is feeling less anxious and depressed Patient is currently not working No si/hi Eating well but it "comes and goes" at time  Still with his girlfriend, who had tested negative.  Asked why his blood pressure here is high. He doesn't check at home   11/13/21 id clinic f/u He complains of depression He also has been hypertensive when he comes 160s/100s Patient works night shift He still smokes He drinks on the weekend, but no binge Takes his hiv med everyday 9am He is still with his girlfriend He doesn't know what is causing his depression (he said he gets depressed when he thinks about the virus)   No hi/si. Some sleeping issue falling asleep after coming home from work. No anhedonia. No interelationship/work interference. No other stressors. No gun at home   05/15/22 id f/u Girlfriend taking prep, has hair falling out and wondering if she needs it. Patient has been < 50 since 08/2021 No missed dose  of biktarvy last 4 weeks He is thinking about cabenuva and will talk to his girflriend more No plan to have kids by his girlfriend  No concern today     12/12/2022 id clinic f/u Lots of question today Prilosec otc recently started for reflux Been anxious since diagnosis and thinks it interfers with sleep, personal relationship and would like to discuss with psychiatry No missed dose biktarvy last 4 weeks - wants to keep on this right  now In same relationship with his girlfriend No plan to have children at this time Girlfriend had stopped taking prep  No other complaint  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    Review of Systems: ROS All other ros negative      Past Medical History:  Diagnosis Date   Environmental allergies    HIV (human immunodeficiency virus infection) (HCC)    HTN (hypertension)    Migraine    Pain in left knee 10/15/2017   Pain in right knee 12/22/2017   Strain of calf muscle 10/15/2017    Social History   Tobacco Use   Smoking status: Every Day    Current packs/day: 1.00    Types: Cigarettes   Smokeless tobacco: Never  Vaping Use   Vaping status: Never Used  Substance Use Topics   Alcohol use: Yes    Comment: weekends   Drug use: Yes    Frequency: 7.0 times per week    Types: Marijuana    Comment: Marijiuana daily    Fam hx: Grandmother with psoriasis  No Known Allergies  OBJECTIVE: There were no vitals filed for this visit.  There is no height or weight on file to calculate BMI.   Physical Exam General/constitutional: no distress, pleasant HEENT: Normocephalic, PER, Conj Clear, EOMI, Oropharynx clear Neck supple CV: rrr no mrg Lungs: clear to auscultation, normal respiratory effort Abd: Soft, Nontender Ext: no edema Skin: No Rash Neuro: nonfocal MSK: no peripheral joint swelling/tenderness/warmth; back spines nontender             Lab: Lab Results  Component Value Date   WBC 6.4 05/15/2022   HGB 13.0 (L) 05/15/2022   HCT 42.5 05/15/2022   MCV 80.5 05/15/2022   PLT 199 05/15/2022   Last metabolic panel Lab Results  Component Value Date   GLUCOSE 108 (H) 12/05/2022   NA 138 12/05/2022   K 3.8 12/05/2022   CL 101 12/05/2022   CO2 30 12/05/2022   BUN 15 12/05/2022   CREATININE 1.39 12/05/2022   GFRNONAA >60 02/25/2021   CALCIUM 9.5 12/05/2022   PROT 7.5 05/15/2022   ALBUMIN 4.2 02/25/2021   BILITOT 0.6 05/15/2022    ALKPHOS 48 02/25/2021   AST 22 05/15/2022   ALT 25 05/15/2022   ANIONGAP 8 02/25/2021   HIV: 11/2021   <20 08/2021     41 07/2021                  /     119 (8%) 05/2021    111k    05/2021 hiv genotype Comment: HIV Subtype: B ___________________________________________________________ Antiretroviral drugs      Resistance  Mutations Detected                           Predicted ___________________________________________________________                                 !   !  NRTIs                     !   ! ZDV (zidovudine or Retrovir)    ! NO!     ABC (abacavir or Ziagen)        ! NO!     ddI (didanosine or Videx)       ! NO!     3TC (lamivudine or Epivir)      ! NO!     FTC (emtricitabine or Emtriva)  ! NO!     d4T (stavudine or Zerit)        ! NO!     TDF (tenofovir or Viread)       ! NO!     ________________________________!___!______________________                                 !   !       NNRTIs                    !   ! ETR (etravirine or Intelence)   ! NO!     EFV (efavirenz or Sustiva)      ! NO!     NVP (nevirapine or Viramune)    ! NO!     RPV (rilpivirine or Edurant)    ! NO!     DOR (doravirine or Pifeltro)    ! NO!     ________________________________!___!______________________                                 !   !       PIs                       !   ! FPV (fos-amprenavir or Lexiva)  ! NO!     IDV (indinavir or Crixivan)     ! NO!     NFV (nelfinavir or Viracept)    ! NO!     SQV (saquinavir or Invirase)    ! NO!     LPV (lopinavir or Kaletra)      ! NO!     ATV (atazanavir or Reyataz)     ! NO!     TPV (tipranavir or Aptivus)     ! NO!     DRV (darunavir or Prezista)     ! NO!                                     !   ! ________________________________!___!______________________ PRB = PROBABLE OR EMERGING RESISTANCE OTHER MUTATIONS DETECTED: RT GENE MUTATIONS: R211K PR GENE MUTATIONS:  G16A,I62V,L63T,V77I ___________________________________________________________     Microbiology:  Serology:  Imaging:   Assessment/plan: Problem List Items Addressed This Visit       Other   HIV disease (HCC)   Relevant Orders   HIV 1 RNA quant-no reflex-bld   Other Visit Diagnoses     Need for prophylactic vaccination with Streptococcus pneumoniae (Pneumococcus) and Influenza vaccines    -  Primary   Anxiety       Relevant Orders   Ambulatory referral to Psychiatry   Need for hepatitis B screening test       Relevant Orders   Hepatitis  B surface antibody,quantitative        #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 Explain somebody with receptor mutation might be more resistant in acquiring hiv. Explains that he might have gotten it in 08/2017 but didn't seroconvert yet (was just a hiv antibody test)  Explains to him he'll be almost living a normal life as long as he takes his biktarvy. He has been 100% compliant   05/15/22 he asked about cabenuva again/side effect. He seems interested, but will talk to his girlfriend again before deciding. His gf is taking prep and has some hair loss which she thinks is due to prep. I discussed there has been no sexual transmission when he is undetectable which he has been (<50) since 08/2021.   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 Remains nonreactive rpr 11/2021  -deferred today 12/2022 at his request; same relationship/closed   #anxiety disorder #insomnia Counsel patient on timing/dx of depression and allowing time for his body to adjust He is having depressed mood  and falling asleep issue but no full blown depression  Trazodone working well for him 100 at bedtime  -referral to psychiatry for consideration of anxiolytics; no panic attack but generalized anziety with personal relationship/sleep disturbance   #hcm -vaccinations Prevnar 20 today  -hepatitis screening Immunized/responder to hep b; sAb>10 Recheck hepatitis b sAb level today 12/12/22  -std screening  Gc/chlam and syphilis negative 05/13/2021 Fta reactive 07/2021; see syphilis hx above 12/12/22 didn't want std testing -tb screening Will discuss -cancer screening N/a      Follow-up: Return in about 6 months (around 06/11/2023).  Raymondo Band, MD [facility] for Infectious [facility] [phone] pager   [phone] cell 12/12/2022, 10:22 AM

## 2022-12-12 NOTE — Addendum Note (Signed)
Addended by: Clayborne Artist A on: 12/12/2022 12:25 PM   Modules accepted: Orders

## 2022-12-12 NOTE — Patient Instructions (Signed)
Psychiatry referral placed   Pneumonia vaccine today; this is a one time vaccination for pneumonia   Continue biktarvy   Labs today  See me in 6 months

## 2022-12-16 ENCOUNTER — Telehealth: Payer: Self-pay

## 2022-12-16 LAB — HIV-1 RNA QUANT-NO REFLEX-BLD
HIV 1 RNA Quant: 25 {copies}/mL — ABNORMAL HIGH
HIV-1 RNA Quant, Log: 1.4 {Log_copies}/mL — ABNORMAL HIGH

## 2022-12-16 LAB — HEPATITIS B SURFACE ANTIBODY, QUANTITATIVE: Hep B S AB Quant (Post): 38 m[IU]/mL (ref >=10)

## 2022-12-16 NOTE — Telephone Encounter (Signed)
Patient called to discuss lab results from 11/8. Relayed that viral load undetectable and that he has immunity to hepatitis B. Patient verbalized understanding and has no further questions.   Sandie Ano, RN

## 2022-12-30 ENCOUNTER — Encounter: Payer: Self-pay | Admitting: Family Medicine

## 2022-12-30 ENCOUNTER — Ambulatory Visit: Payer: Medicaid Other | Admitting: Family Medicine

## 2022-12-30 VITALS — BP 124/76 | HR 105 | Temp 98.0°F | Ht 70.0 in | Wt 286.0 lb

## 2022-12-30 DIAGNOSIS — B349 Viral infection, unspecified: Secondary | ICD-10-CM | POA: Diagnosis not present

## 2022-12-30 DIAGNOSIS — J029 Acute pharyngitis, unspecified: Secondary | ICD-10-CM

## 2022-12-30 DIAGNOSIS — R52 Pain, unspecified: Secondary | ICD-10-CM | POA: Diagnosis not present

## 2022-12-30 DIAGNOSIS — H66002 Acute suppurative otitis media without spontaneous rupture of ear drum, left ear: Secondary | ICD-10-CM

## 2022-12-30 DIAGNOSIS — R509 Fever, unspecified: Secondary | ICD-10-CM | POA: Diagnosis not present

## 2022-12-30 DIAGNOSIS — R6883 Chills (without fever): Secondary | ICD-10-CM

## 2022-12-30 LAB — POC COVID19 BINAXNOW: SARS Coronavirus 2 Ag: NEGATIVE

## 2022-12-30 LAB — POCT INFLUENZA A/B
Influenza A, POC: NEGATIVE
Influenza B, POC: NEGATIVE

## 2022-12-30 LAB — POCT RAPID STREP A (OFFICE): Rapid Strep A Screen: NEGATIVE

## 2022-12-30 MED ORDER — AMOXICILLIN-POT CLAVULANATE 875-125 MG PO TABS
1.0000 | ORAL_TABLET | Freq: Two times a day (BID) | ORAL | 0 refills | Status: AC
Start: 2022-12-30 — End: 2023-01-06

## 2022-12-30 NOTE — Patient Instructions (Signed)
May take ibuprofen/tylenol for aches and fevers  Immodium for diarrhea as needed

## 2022-12-30 NOTE — Progress Notes (Signed)
Acute Office Visit  Subjective:     Patient ID: Keith Blackwell, male    DOB: 07-25-1985, 37 y.o.   MRN: [id number]  Chief Complaint  Patient presents with   Generalized Body Aches    Since last week, was at the [facility] and came out sweating and all started from there  Took covid test day  before yesterday- negative   Sore Throat   Night Sweats    Deep sweats throughout day and night   Cough    Taken mucinex but hasn't helped    Sore Throat  Associated symptoms include coughing.  Cough   Patient is in today for cough, body aches, chills, headaches, fever, diarrhea, fatigue for about 5 days. Has taken mucinex, ginger ale, OTC tylenol with little relief. Denies nausea, vomiting, rash, other symptoms. Denies known sick contacts Neg home COVID test x 2 days ago   Review of Systems  Respiratory:  Positive for cough.    Per HPI      Objective:    BP 124/76 (BP Location: Left Arm, Patient Position: Sitting, Cuff Size: Large)   Pulse (!) 105   Temp 98 F (36.7 C) (Temporal)   Ht 5\' 10"  (1.778 m)   Wt 286 lb (129.7 kg)   SpO2 97%   BMI 41.04 kg/m    Physical Exam Constitutional:      Appearance: He is well-developed.  HENT:     Head: Normocephalic and atraumatic.     Right Ear: Ear canal normal. A middle ear effusion is present.     Left Ear: Ear canal normal. A middle ear effusion is present. Tympanic membrane is erythematous and bulging.     Nose: Congestion and rhinorrhea present.     Mouth/Throat:     Mouth: Mucous membranes are moist.     Pharynx: Oropharynx is clear. Posterior oropharyngeal erythema present. No pharyngeal swelling or oropharyngeal exudate.  Eyes:     Conjunctiva/sclera: Conjunctivae normal.     Pupils: Pupils are equal, round, and reactive to light.  Cardiovascular:     Rate and Rhythm: Normal rate and regular rhythm.     Heart sounds: Normal heart sounds.  Pulmonary:     Effort: Pulmonary effort is normal.     Breath sounds: Normal  breath sounds.  Abdominal:     General: There is no distension.     Palpations: Abdomen is soft.  Musculoskeletal:     Cervical back: Normal range of motion.  Lymphadenopathy:     Cervical: Cervical adenopathy present.  Neurological:     General: No focal deficit present.     Mental Status: He is alert and oriented to person, place, and time.     Results for orders placed or performed in visit on 12/30/22  POCT Influenza A/B  Result Value Ref Range   Influenza A, POC Negative Negative   Influenza B, POC Negative Negative  POCT rapid strep A  Result Value Ref Range   Rapid Strep A Screen Negative Negative  POC COVID-19 BinaxNow  Result Value Ref Range   SARS Coronavirus 2 Ag Negative Negative        Assessment & Plan:   1. Non-recurrent acute suppurative otitis media of left ear without spontaneous rupture of tympanic membrane  - amoxicillin-clavulanate (AUGMENTIN) 875-125 MG tablet; Take 1 tablet by mouth every 12 (twelve) hours for 7 days.  Dispense: 14 tablet; Refill: 0  2. Viral illness  We are testing for flu and COVID, we  will be in contact with any abnormal results. Otherwise, stay home and away from everyone until you are fever free for 24 hours without ibuprofen or Tylenol. - SARS-COV-2 IgG - POCT Influenza A/B  3. Sore throat  Continue to drink plenty of fluids, may take ibuprofen and Tylenol as needed for sore throat - SARS-COV-2 IgG - POCT Influenza A/B - POCT rapid strep A  4. Fever, unspecified fever cause  May take ibuprofen and Tylenol as needed for fever - SARS-COV-2 IgG - POCT Influenza A/B - POCT rapid strep A  5. Chills  - SARS-COV-2 IgG - POCT Influenza A/B  6. Body aches  May take ibuprofen and Tylenol as needed for body aches - SARS-COV-2 IgG - POCT Influenza A/B    Meds ordered this encounter  Medications   amoxicillin-clavulanate (AUGMENTIN) 875-125 MG tablet    Sig: Take 1 tablet by mouth every 12 (twelve) hours for 7  days.    Dispense:  14 tablet    Refill:  0    Return if symptoms worsen or fail to improve.  Moshe Cipro, FNP

## 2023-01-12 ENCOUNTER — Other Ambulatory Visit: Payer: Self-pay | Admitting: Internal Medicine

## 2023-01-12 DIAGNOSIS — G4700 Insomnia, unspecified: Secondary | ICD-10-CM

## 2023-01-12 NOTE — Telephone Encounter (Signed)
 Patient has primary care provider.   Sandie Ano, RN

## 2023-01-15 ENCOUNTER — Ambulatory Visit: Payer: Medicaid Other | Admitting: Neurology

## 2023-01-15 ENCOUNTER — Encounter: Payer: Self-pay | Admitting: Neurology

## 2023-01-15 ENCOUNTER — Telehealth: Payer: Self-pay | Admitting: Neurology

## 2023-01-15 VITALS — BP 127/73 | HR 104 | Ht 69.0 in | Wt 291.0 lb

## 2023-01-15 DIAGNOSIS — R0689 Other abnormalities of breathing: Secondary | ICD-10-CM

## 2023-01-15 DIAGNOSIS — R0683 Snoring: Secondary | ICD-10-CM | POA: Diagnosis not present

## 2023-01-15 DIAGNOSIS — G4726 Circadian rhythm sleep disorder, shift work type: Secondary | ICD-10-CM | POA: Insufficient documentation

## 2023-01-15 DIAGNOSIS — G4719 Other hypersomnia: Secondary | ICD-10-CM | POA: Diagnosis not present

## 2023-01-15 DIAGNOSIS — I10 Essential (primary) hypertension: Secondary | ICD-10-CM

## 2023-01-15 MED ORDER — MODAFINIL 200 MG PO TABS: 200.0000 mg | ORAL_TABLET | Freq: Every day | ORAL | 5 refills | Status: DC

## 2023-01-15 NOTE — Telephone Encounter (Signed)
[facility] pending faxed notes
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
## 2023-01-15 NOTE — Patient Instructions (Signed)
Hypersomnia Hypersomnia is a condition in which a person feels very tired during the day even though the person gets plenty of sleep at night. A person with this condition may take naps during the day and may find it very difficult to wake up from sleep. Hypersomnia may affect a person's ability to think, concentrate, drive, or remember things. What are the causes? The cause of this condition may not be known. Possible causes include: Taking certain medicines. Using drugs or alcohol. Sleep disorders, such as narcolepsy and sleep apnea. Injury to the head, brain, or spinal cord. Tumors. Certain medical conditions. These include: Depression. Diabetes. Gastroesophageal reflux disease (GERD). An underactive thyroid gland (hypothyroidism). What are the signs or symptoms? The main symptoms of hypersomnia include: Feeling very tired throughout the day, regardless of how much sleep you got the night before. Having trouble waking up. Others may find it difficult to wake you up when you are sleeping. Sleeping for longer and longer periods at a time. Taking naps throughout the day. Other symptoms may include: Feeling restless, anxious, or annoyed. Lacking energy. Having trouble with: Remembering. Speaking. Thinking. Loss of appetite. Seeing, hearing, tasting, smelling, or feeling things that are not real (hallucinations). How is this diagnosed? This condition may be diagnosed based on: Your symptoms and medical history. Your sleeping habits. Your health care provider may ask you to write down your sleeping habits in a daily sleep log, along with any symptoms you have. A series of tests that are done while you sleep (sleep study or polysomnogram). A test that measures how quickly you can fall asleep during the day (daytime nap study or multiple sleep latency test). How is this treated? This condition may be treated by: Following a regular sleep routine. Making lifestyle changes, such as  changing your eating habits, getting regular exercise, and avoiding alcohol or caffeinated beverages. Taking medicines to make you more alert (stimulants) during the day. Treating any underlying medical causes of hypersomnia. Follow these instructions at home: Sleep habits Stick to a routine that includes going to bed and waking up at the same times every day and night. Practice a relaxing bedtime routine. This may include reading, meditation, deep breathing, or taking a warm bath before going to sleep. Exercise regularly as told by your health care provider. However, avoid exercising in the hours right before bedtime. Keep your sleep environment at a cooler temperature, darkened, and quiet. Sleep with pillows and a mattress that are comfortable and supportive. Schedule short 20-minute naps for when you feel sleepiest during the day. Talk with your employer or teachers about your hypersomnia. If possible, adjust your schedule so that: You have a regular daytime work schedule. You can take a scheduled nap during the day. You do not have to work or be active at night. Do not eat a heavy meal for a few hours before bedtime. Eat your meals at about the same times every day. Safety  Do not drive or use machinery if you are sleepy. Ask your health care provider if it is safe for you to drive. Wear a life jacket when swimming or spending time near water. General instructions  Take over-the-counter and prescription medicines only as told by your health care provider. This includes supplements. Avoid drinking alcohol or caffeinated beverages. Keep a sleep log that will help your health care provider manage your condition. This may include information about: What time you go to bed each night. How often you wake up at night. How many hours  you sleep at night. How often and for how long you nap during the day. Any observations from others, such as leg movements during sleep, sleep walking, or  snoring. Keep all follow-up visits. This is important. Contact a health care provider if: You have new symptoms. Your symptoms get worse. Get help right away if: You have thoughts about hurting yourself or someone else. Get help right away if you feel like you may hurt yourself or others, or have thoughts about taking your own life. Go to your nearest emergency room or: Call 911. Call the National Suicide Prevention Lifeline at 867-169-0783 or 988. This is open 24 hours a day. Text the Crisis Text Line at 2258277553. Summary Hypersomnia refers to a condition in which you feel very tired during the day even though you get plenty of sleep at night. A person with this condition may take naps during the day and may find it very difficult to wake up from sleep. Hypersomnia may affect a person's ability to think, concentrate, drive, or remember things. Treatment may include a regular sleep routine and making some lifestyle changes. This information is not intended to replace advice given to you by your health care provider. Make sure you discuss any questions you have with your health care provider. Document Revised: 12/31/2020 Document Reviewed: 12/31/2020 Elsevier Patient Education  2024 Elsevier Inc. Sleep Apnea  Sleep apnea is a condition that affects your breathing while you are sleeping. Your tongue or soft tissue in your throat may block the flow of air while you sleep. You may have shallow breathing or stop breathing for short periods of time. People with sleep apnea may snore loudly. There are three kinds of sleep apnea: Obstructive sleep apnea. This kind is caused by a blocked or collapsed airway. This is the most common. Central sleep apnea. This kind happens when the part of the brain that controls breathing does not send the correct signals to the muscles that control breathing. Mixed sleep apnea. This is a combination of obstructive and central sleep apnea. What are the causes? The  most common cause of sleep apnea is a collapsed or blocked airway. What increases the risk? Being very overweight. Having family members with sleep apnea. Having a tongue or tonsils that are larger than normal. Having a small airway or jaw problems. Being older. What are the signs or symptoms? Loud snoring. Restless sleep. Trouble staying asleep. Being sleepy or tired during the day. Waking up gasping or choking. Having a headache in the morning. Mood swings. Having a hard time remembering things and concentrating. How is this diagnosed? A medical history. A physical exam. A sleep study. This is also called a polysomnography test. This test is done at a sleep lab or in your home while you are sleeping. How is this treated? Treatment may include: Sleeping on your side. Losing weight if you're overweight. Wearing an oral appliance. This is a mouthpiece that moves your lower jaw forward. Using a positive airway pressure (PAP) device to keep your airways open while you sleep, such as: A continuous positive airway pressure (CPAP) device. This device gives forced air through a mask when you breathe out. This keeps your airways open. A bilevel positive airway pressure (BIPAP) device. This device gives forced air through a mask when you breathe in and when you breathe out to keep your airways open. Having surgery if other treatments do not work. If your sleep apnea is not treated, you may be at risk for: Heart  failure. Heart attack. Stroke. Type 2 diabetes or a problem with your blood sugar called insulin resistance. Follow these instructions at home: Medicines Take your medicines only as told by your health care provider. Avoid alcohol, medicines to help you relax, and certain pain medicines. These may make sleep apnea worse. General instructions Do not smoke, vape, or use products with nicotine or tobacco in them. If you need help quitting, talk with your provider. If you were given  a PAP device to open your airway while you sleep, use it as told by your provider. If you're having surgery, make sure to tell your provider you have sleep apnea. You may need to bring your PAP device with you. Contact a health care provider if: The PAP device that you were given to use during sleep bothers you or does not seem to be working. You do not feel better or you feel worse. Get help right away if: You have trouble breathing. You have chest pain. You have trouble talking. One side of your body feels weak. A part of your face is hanging down. These symptoms may be an emergency. Call 911 right away. Do not wait to see if the symptoms will go away. Do not drive yourself to the hospital. This information is not intended to replace advice given to you by your health care provider. Make sure you discuss any questions you have with your health care provider. Document Revised: 03/27/2022 Document Reviewed: 03/27/2022 Elsevier Patient Education  2024 [employer].

## 2023-01-19 NOTE — Telephone Encounter (Signed)
 HST M[facility] Healthy blue no auth req via fax

## 2023-01-30 ENCOUNTER — Other Ambulatory Visit (HOSPITAL_COMMUNITY): Payer: Self-pay

## 2023-01-30 ENCOUNTER — Telehealth: Payer: Self-pay

## 2023-01-30 NOTE — Telephone Encounter (Signed)
Pharmacy Patient Advocate Encounter   Received notification from CoverMyMeds that prior authorization for Modafinil 200MG  tablets is required/requested.   Insurance verification completed.   The patient is insured through [facility] .   Per test claim: PA required; PA submitted to above mentioned insurance via CoverMyMeds Key/confirmation #/EOC [id number] Status is pending

## 2023-02-02 NOTE — Telephone Encounter (Signed)
Pharmacy Patient Advocate Encounter  Received notification from [facility] that Prior Authorization for Modafinil 200MG  tablets has been DENIED.  Full denial letter will be uploaded to the media tab. See denial reason below.    PA #/Case ID/Reference #: PA Case ID #: [id number]

## 2023-02-03 NOTE — Telephone Encounter (Signed)
 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   2) Witnessed sleep apnea, video review and snoring- with high risk factors for OSA- BMI, neck size and oral anatomy.  Strong family history of apnea.      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 He is cautioned to not drive when excessively sleepy, he can use cat naps to help with sleepiness.   He has no symptoms that indicate narcolepsy, no vivid dream arousals, a hallucinations, cataplexy or sleep paralysis.

## 2023-02-06 ENCOUNTER — Other Ambulatory Visit (HOSPITAL_COMMUNITY): Payer: Self-pay

## 2023-02-06 ENCOUNTER — Telehealth: Payer: Self-pay

## 2023-02-06 ENCOUNTER — Encounter (HOSPITAL_COMMUNITY): Payer: Self-pay

## 2023-02-06 NOTE — Telephone Encounter (Signed)
 PA request has been Submitted. New Encounter created for follow up. For additional info see Pharmacy Prior Auth telephone encounter from 02/06/2023 for Provigil BRAND.

## 2023-02-06 NOTE — Telephone Encounter (Signed)
 Pharmacy Patient Advocate Encounter   Received notification from Physician's Office that prior authorization for Provigil  200MG  tablets is required/requested.   Insurance verification completed.   The patient is insured through [facility] .   Per test claim: PA required; PA submitted to above mentioned insurance via CoverMyMeds Key/confirmation #/EOC [id number] Status is pending

## 2023-02-09 ENCOUNTER — Other Ambulatory Visit (HOSPITAL_COMMUNITY): Payer: Self-pay

## 2023-02-09 NOTE — Telephone Encounter (Signed)
 Pharmacy Patient Advocate Encounter  Received notification from [facility] that Prior Authorization for Provigil  has been APPROVED from 02/06/2023 to 02/06/2024. Ran test claim, Copay is $4.00. This test claim was processed through [facility]- copay amounts may vary at other pharmacies due to pharmacy/plan contracts, or as the patient moves through the different stages of their insurance plan.   PA #/Case ID/Reference #: PA Case ID #: [id number]  PLEASE SEND IN NEW RX FOR BRAND PROVIGIL  200MG  TABLETS

## 2023-02-10 ENCOUNTER — Ambulatory Visit (HOSPITAL_COMMUNITY): Payer: Medicaid Other | Admitting: Physician Assistant

## 2023-02-19 ENCOUNTER — Ambulatory Visit (HOSPITAL_COMMUNITY): Payer: Medicaid Other | Admitting: Licensed Clinical Social Worker

## 2023-02-19 ENCOUNTER — Encounter (HOSPITAL_COMMUNITY): Payer: Self-pay | Admitting: Licensed Clinical Social Worker

## 2023-02-19 DIAGNOSIS — F331 Major depressive disorder, recurrent, moderate: Secondary | ICD-10-CM

## 2023-02-19 DIAGNOSIS — F411 Generalized anxiety disorder: Secondary | ICD-10-CM | POA: Diagnosis not present

## 2023-02-19 NOTE — Progress Notes (Signed)
Comprehensive Clinical Assessment (CCA) Note  02/19/2023 Keith Blackwell [id number]  Chief Complaint:  Chief Complaint  Patient presents with   Anxiety   nightmares   Depression   Visit Diagnosis: MDD and GAD    Virtual Visit via Video Note  I connected with Keith Blackwell on 02/19/23 at  9:00 AM EST by a video enabled telemedicine application and verified that I am speaking with the correct person using two identifiers.  Location: Patient: [facility]  Provider: Providers Home    I discussed the limitations of evaluation and management by telemedicine and the availability of in person appointments. The patient expressed understanding and agreed to proceed.  Client is a 38 year old  male. Client is referred by [facility] team for a depression and anxiety.   Client states mental health symptoms as evidenced by:   Depression Change in energy/activity; Fatigue; Hopelessness; Worthlessness; Increase/decrease in appetite; Irritability; Difficulty Concentrating; Sleep (too much or little); Tearfulness Change in energy/activity; Fatigue; Hopelessness; Worthlessness; Increase/decrease in appetite; Irritability; Difficulty Concentrating; Sleep (too much or little); Tearfulness  Duration of Depressive Symptoms Greater than two weeks Greater than two weeks  Mania None None  Anxiety Difficulty concentrating; Fatigue; Irritability; Restlessness; Sleep; Tension; Worrying Difficulty concentrating; Fatigue; Irritability; Restlessness; Sleep; Tension; Worrying  Psychosis None None  Trauma None None  Obsessions None None  Compulsions None None  Inattention None None  Hyperactivity/Impulsivity None None  Oppositional/Defiant Behaviors None None  Emotional Irregularity None None   Client was screened for the following SDOH: Smoking, exercise, stress\tension, PHQ-9, housing, and health literacy.  Assessment Information that integrates subjective and objective details with a  therapist's professional interpretation:    Patient was alert and oriented x 5.  Keith Blackwell was pleasant, cooperative, and maintained good eye contact.  He engaged well in comprehensive clinical assessment and was dressed casually.  He presented today with anxious mood\affect.  Patient comes in as a referral from infectious disease team due to being HIV positive.  Patient endorses symptoms for insomnia, reoccurring nightmares about death, suicidal ideations without plan or intent, worthlessness and hopelessness feelings.  He also reports symptoms for anxiety as tension and worry.  He reports because he is HIV positive that he is letting his family down.   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  He reports no problems in his relationship.  Keith Blackwell states that he has a good overall relationship with his children.  He reports that he attends church 2 times monthly.  LCSW administered a PHQ-9 and GAD-7.  Patient scored in the severe category for both PHQ-9 and GAD-7.  LCSW and patient safety plan for suicidal ideations as LCSW provided patient resources to behavioral health urgent care at [address] in [place], [place].  LCSW also provided patient with resources to suicide prevention hotline 988.  Patient reports goals of being able to talk to someone about his illness and coping skills to help decrease his anxiety and depression.  Client states use of the following substances: Marijuana 1/8 weekly  Therapist addressed (substance use) concern, although client meets criteria, he/ she reports they do not wish to pursue tx at this time although therapist feels they would benefit from SA counseling.   Clinician assisted client with scheduling the following appointments: February 21 8 AM virtual.. Clinician details of appointment.    Client was in agreement with treatment recommendations.      I discussed the assessment and treatment plan with  the patient. The patient was provided  an opportunity to ask questions and all were answered. The patient agreed with the plan and demonstrated an understanding of the instructions.   The patient was advised to call back or seek an in-person evaluation if the symptoms worsen or if the condition fails to improve as anticipated.  I provided 45 minutes of non-face-to-face time during this encounter.   Keith Cooks, LCSW  CCA Screening, Triage and Referral (STR)  Patient Reported Information Referral name: Infectious Disease Team at [facility] do you see for routine medical problems? Primary Care  Practice/Facility Name: [facility], Keith Blackwell  What Do You Feel Would Help You the Most Today? Treatment for Depression or other mood problem; Stress Management; Medication(s)   Have You Recently Been in Any Inpatient Treatment (Hospital/Detox/Crisis Center/28-Day Program)? No   Have You Ever Received Services From [facility] [facility] Before? Yes  Who Do You See at [facility]? Multiple specalities at [facility]   Have You Recently Had Any Thoughts About Hurting Yourself? Yes  Are You Planning to Commit Suicide/Harm Yourself At This time? No   Have you Recently Had Thoughts About Hurting Someone Keith Blackwell? No   Have You Used Any Alcohol or Drugs in the Past 24 Hours? Yes (marijuana)  What Did You Use and How Much? 1  blunt   Do You Currently Have a Therapist/Psychiatrist? No     CCA Screening Triage Referral Assessment Type of Contact: Tele-Assessment  Is this Initial or Reassessment? Initial Assessment  Date Telepsych consult ordered in [facility]:  02/19/23   Is CPS involved or ever been involved? Never  Is APS involved or ever been involved? Never   Patient Determined To Be At Risk for Harm To Self or Others Based on Review of Patient Reported Information or Presenting Complaint? Yes, for Self-Harm  Method: No Plan  Availability of Means: No access or NA  Intent: Vague intent or NA  Notification Required:  No need or identified person  Are There Guns or Other Weapons in Your Home? No  Location of Assessment: -- (virtual session pt home)    [place] of Residence: [place]   Options For Referral: Outpatient Therapy; Medication Management     CCA Biopsychosocial Intake/Chief Complaint:  Pt reports that he has been having dreams on "not being here". He feels like he has let his family down because he is sick with HIV+.  Current Symptoms/Problems: No data recorded  Patient Reported Schizophrenia/Schizoaffective Diagnosis in Past: No   Strengths: willing to engage in treatment  Preferences: therapy and medicaton mgnt  Abilities: none reported   Type of Services Patient Feels are Needed: therapy and medication mgnt   Initial Clinical Notes/Concerns: SI with no plan or intent   Mental Health Symptoms Depression:  Change in energy/activity; Fatigue; Hopelessness; Worthlessness; Increase/decrease in appetite; Irritability; Difficulty Concentrating; Sleep (too much or little); Tearfulness   Duration of Depressive symptoms: Greater than two weeks   Mania:  None   Anxiety:   Difficulty concentrating; Fatigue; Irritability; Restlessness; Sleep; Tension; Worrying   Psychosis:  None   Duration of Psychotic symptoms: No data recorded  Trauma:  None   Obsessions:  None   Compulsions:  None   Inattention:  None   Hyperactivity/Impulsivity:  None   Oppositional/Defiant Behaviors:  None   Emotional Irregularity:  None   Other Mood/Personality Symptoms:  No data recorded   Mental Status Exam Appearance and self-care  Stature:  Average   Weight:  Average weight   Clothing:  Casual   Grooming:  Normal   Cosmetic use:  None   Posture/gait:  Normal   Motor activity:  Not Remarkable   Sensorium  Attention:  Normal   Concentration:  Normal   Orientation:  X5   Recall/memory:  Normal   Affect and Mood  Affect:  Anxious   Mood:  Anxious   Relating  Eye  contact:  Normal   Facial expression:  Anxious   Attitude toward examiner:  Cooperative   Thought and Language  Speech flow: Clear and Coherent   Thought content:  Appropriate to Mood and Circumstances   Preoccupation:  None   Hallucinations:  None   Organization:  No data recorded  [employer] of Knowledge:  Fair   Intelligence:  Average   Abstraction:  Normal   Judgement:  Fair   Dance movement psychotherapist:  Realistic   Insight:  Fair   Decision Making:  Normal   Social Functioning  Social Maturity:  Isolates   Social Judgement:  Normal   Stress  Stressors:  Illness   Coping Ability:  Exhausted; Overwhelmed   Skill Deficits:  Interpersonal   Supports:  Church; Family     Religion: Religion/Spirituality Are You A Religious Person?: Yes What is Your Religious Affiliation?: Non-Denominational  Leisure/Recreation: Leisure / Recreation Do You Have Hobbies?: No  Exercise/Diet: Exercise/Diet Do You Exercise?: Yes What Type of Exercise Do You Do?: Run/Walk How Many Times a Week Do You Exercise?: 1-3 times a week Have You Gained or Lost A Significant Amount of Weight in the Past Six Months?: Yes-Lost Number of Pounds Lost?: 5 Do You Follow a Special Diet?: No Do You Have Any Trouble Sleeping?: Yes Explanation of Sleeping Difficulties: falling andstaying asleep   CCA Employment/Education Employment/Work Situation: Employment / Work Situation Employment Situation: Employed 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?: No  Education: Education Last Grade Completed: 12 Did Careers adviser From [employer]?: Yes Did Theme park manager?: No Did Designer, television/film set?: No Did You Have An Individualized Education Program (IIEP): No Did You Have Any Difficulty At [employer]?: No Patient's Education Has Been Impacted by Current Illness: No   CCA Family/Childhood History Family and Relationship History: Family history Marital status: Long term relationship Long term relationship, how long?: 5 years What types of issues is patient dealing with in the relationship?: none reported Are you sexually active?: Yes What is your sexual orientation?: hetrosexual Has your sexual activity been affected by drugs, alcohol, medication, or emotional stress?: none reported Does patient have children?: Yes How is patient's relationship with their children?: goood  Childhood History:  Childhood History By whom was/is the patient raised?: Other (Comment) (grandmother) Description of patient's relationship with caregiver when they were a child: good Patient's description of current relationship with people who raised him/her: good Does patient have siblings?: Yes Number of Siblings: 1 Description of patient's current relationship with siblings: good Did patient suffer any verbal/emotional/physical/sexual abuse as a child?: No Did patient suffer from severe childhood neglect?: No Has patient ever been sexually abused/assaulted/raped as an adolescent  or adult?: No Was the patient ever a victim of a crime or a disaster?: No Witnessed domestic violence?: No Has patient been affected by domestic violence as an adult?: No  Child/Adolescent Assessment:     CCA Substance Use Alcohol/Drug Use: Alcohol / Drug Use History of alcohol / drug use?: Yes Substance #1 Name of Substance 1: Marijuana 1 - Amount (size/oz): 1/8 1 - Frequency: weekly 1 - Last Use / Amount: last night 1 - Method of Aquiring: dealer 1- Route of Use: inhale                       ASAM's:  Six  Dimensions of Multidimensional Assessment  Dimension 1:  Acute Intoxication and/or Withdrawal Potential:      Dimension 2:  Biomedical Conditions and Complications:      Dimension 3:  Emotional, Behavioral, or Cognitive Conditions and Complications:     Dimension 4:  Readiness to Change:     Dimension 5:  Relapse, Continued use, or Continued Problem Potential:     Dimension 6:  Recovery/Living Environment:     ASAM Severity Score:    ASAM Recommended Level of Treatment:     Substance use Disorder (SUD)    Recommendations for Services/Supports/Treatments:    DSM5 Diagnoses: Patient Active Problem List   Diagnosis Date Noted   Major depressive disorder, recurrent episode, moderate (HCC) 02/19/2023   GAD (generalized anxiety disorder) 02/19/2023   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   Sleep related choking sensation 01/15/2023   Hypertriglyceridemia 12/05/2022   Uncontrolled hypertension 08/15/2022   Obesity, morbid (HCC) 08/15/2022   Snores 08/15/2022   Excessive daytime sleepiness 08/15/2022   HIV disease (HCC) 08/15/2022   At risk for sleep apnea 08/15/2022   Current every day smoker 08/15/2022   Acute pain of right knee 12/22/2017     Referrals to Alternative Service(s): Referred to Alternative Service(s):   Place:   Date:   Time:    Referred to Alternative Service(s):   Place:   Date:   Time:    Referred to Alternative Service(s):   Place:   Date:   Time:    Referred to Alternative Service(s):   Place:   Date:   Time:      Collaboration of Care: Other None today   Patient/Guardian was advised Release of Information must be obtained prior to any record release in order to collaborate their care with an outside provider. Patient/Guardian was advised if they have not already done so to contact the registration department to sign all necessary forms in order for [place] to release information regarding their care.   Consent: Patient/Guardian gives verbal consent for  treatment and assignment of benefits for services provided during this visit. Patient/Guardian expressed understanding and agreed to proceed.   Keith Cooks, LCSW

## 2023-03-02 NOTE — Telephone Encounter (Signed)
HST- [facility] [employer] no auth req via fax   Patient is scheduled at [facility] for 03/24/23 at 1 pm.  Mailed packet to the patient.

## 2023-03-10 ENCOUNTER — Encounter: Payer: Self-pay | Admitting: Family Medicine

## 2023-03-10 ENCOUNTER — Ambulatory Visit: Payer: Medicaid Other | Admitting: Family Medicine

## 2023-03-10 VITALS — BP 132/86 | HR 94 | Temp 97.6°F | Ht 69.0 in | Wt 291.0 lb

## 2023-03-10 DIAGNOSIS — B20 Human immunodeficiency virus [HIV] disease: Secondary | ICD-10-CM

## 2023-03-10 DIAGNOSIS — I10 Essential (primary) hypertension: Secondary | ICD-10-CM | POA: Diagnosis not present

## 2023-03-10 DIAGNOSIS — H60502 Unspecified acute noninfective otitis externa, left ear: Secondary | ICD-10-CM

## 2023-03-10 DIAGNOSIS — E781 Pure hyperglyceridemia: Secondary | ICD-10-CM | POA: Diagnosis not present

## 2023-03-10 LAB — LIPID PANEL
Cholesterol: 196 mg/dL (ref 0–200)
HDL: 36.4 mg/dL — ABNORMAL LOW (ref >39.00)
LDL Cholesterol: 112 mg/dL — ABNORMAL HIGH (ref 0–99)
NonHDL: 159.67
Total CHOL/HDL Ratio: 5
Triglycerides: 239 mg/dL — ABNORMAL HIGH (ref 0.0–149.0)
VLDL: 47.8 mg/dL — ABNORMAL HIGH (ref 0.0–40.0)

## 2023-03-10 LAB — CBC WITH DIFFERENTIAL/PLATELET
Basophils Absolute: 0 10*3/uL (ref 0.0–0.1)
Basophils Relative: 0.7 % (ref 0.0–3.0)
Eosinophils Absolute: 0 10*3/uL (ref 0.0–0.7)
Eosinophils Relative: 0.6 % (ref 0.0–5.0)
HCT: 44.9 % (ref 39.0–52.0)
Hemoglobin: 13.9 g/dL (ref 13.0–17.0)
Lymphocytes Relative: 29.7 % (ref 12.0–46.0)
Lymphs Abs: 1.6 10*3/uL (ref 0.7–4.0)
MCHC: 31 g/dL (ref 30.0–36.0)
MCV: 80.2 fL (ref 78.0–100.0)
Monocytes Absolute: 0.5 10*3/uL (ref 0.1–1.0)
Monocytes Relative: 9.9 % (ref 3.0–12.0)
Neutro Abs: 3.3 10*3/uL (ref 1.4–7.7)
Neutrophils Relative %: 59.1 % (ref 43.0–77.0)
Platelets: 215 10*3/uL (ref 150.0–400.0)
RBC: 5.59 Mil/uL (ref 4.22–5.81)
RDW: 15 % (ref 11.5–15.5)
WBC: 5.5 10*3/uL (ref 4.0–10.5)

## 2023-03-10 LAB — COMPREHENSIVE METABOLIC PANEL
ALT: 26 U/L (ref 0–53)
AST: 24 U/L (ref 0–37)
Albumin: 4.6 g/dL (ref 3.5–5.2)
Alkaline Phosphatase: 59 U/L (ref 39–117)
BUN: 19 mg/dL (ref 6–23)
CO2: 28 meq/L (ref 19–32)
Calcium: 9.6 mg/dL (ref 8.4–10.5)
Chloride: 101 meq/L (ref 96–112)
Creatinine, Ser: 1.51 mg/dL — ABNORMAL HIGH (ref 0.40–1.50)
GFR: 58.88 mL/min — ABNORMAL LOW (ref >60.00)
Glucose, Bld: 86 mg/dL (ref 70–99)
Potassium: 4 meq/L (ref 3.5–5.1)
Sodium: 138 meq/L (ref 135–145)
Total Bilirubin: 0.8 mg/dL (ref 0.2–1.2)
Total Protein: 8.6 g/dL — ABNORMAL HIGH (ref 6.0–8.3)

## 2023-03-10 MED ORDER — LEVOFLOXACIN 500 MG PO TABS: 500.0000 mg | ORAL_TABLET | Freq: Every day | ORAL | 0 refills | Status: DC

## 2023-03-10 MED ORDER — CIPROFLOXACIN-DEXAMETHASONE 0.3-0.1 % OT SUSP: 4.0000 [drp] | Freq: Two times a day (BID) | OTIC | 0 refills | Status: DC

## 2023-03-10 MED ORDER — LEVOFLOXACIN 500 MG PO TABS: 500.0000 mg | ORAL_TABLET | Freq: Every day | ORAL | 0 refills | Status: AC

## 2023-03-10 NOTE — Patient Instructions (Addendum)
Please go downstairs for labs.  Take the oral antibiotic and use the ear drops.   I will see you back Thursday

## 2023-03-10 NOTE — Progress Notes (Signed)
 Subjective:     Patient ID: Keith Blackwell, male    DOB: 1986/01/13, 38 y.o.   MRN: [id number]  Chief Complaint  Patient presents with   Medical Management of Chronic Issues    3 month f/u fasting    HPI   History of Present Illness         Here to follow up on chronic health conditions and for a c/o left ear pain.   Left ear pain x 1-2 weeks. He was treated for acute otitis media of the same ear in November and states symptoms resolved.    HTN-reports taking his medication daily without any concerns.  HLD and hypertriglyceridemia.  Recheck fasting lipids  Under the care of of ID for HIV.  Taking Biktarvy .  States he has a bachelor party/trip out of town this weekend.    There are no preventive care reminders to display for this patient.  Past Medical History:  Diagnosis Date   Environmental allergies    HIV (human immunodeficiency virus infection) (HCC)    HTN (hypertension)    Migraine    Pain in left knee 10/15/2017   Pain in right knee 12/22/2017   Strain of calf muscle 10/15/2017    Past Surgical History:  Procedure Laterality Date   HERNIA REPAIR      Family History  Problem Relation Age of Onset   Sleep apnea Mother    Sleep apnea Sister     Social History   Socioeconomic History   Marital status: Single    Spouse name: Not on file   Number of children: Not on file   Years of education: Not on file   Highest education level: Not on file  Occupational History   Not on file  Tobacco Use   Smoking status: Every Day    Current packs/day: 0.50    Average packs/day: 0.5 packs/day for 25.1 years (12.5 ttl pk-yrs)    Types: Cigarettes    Start date: 02/1998   Smokeless tobacco: Never  Vaping Use   Vaping status: Never Used  Substance and Sexual Activity   Alcohol use: Yes    Comment: social events only   Drug use: Yes    Frequency: 7.0 times per week    Types: Marijuana    Comment: weekend use. 1/8th   Sexual activity: Yes     Partners: Female    Birth control/protection: None  Other Topics Concern   Not on file  Social History Narrative   Lives with girlfriend   Pt works    Social Drivers of Corporate Investment Banker Strain: Low Risk  (02/19/2023)   Overall Financial Resource Strain (CARDIA)    Difficulty of Paying Living Expenses: Not hard at all  Food Insecurity: No Food Insecurity (02/19/2023)   Hunger Vital Sign    Worried About Running Out of Food in the Last Year: Never true    Ran Out of Food in the Last Year: Never true  Transportation Needs: No Transportation Needs (02/19/2023)   PRAPARE - Administrator, Civil Service (Medical): No    Lack of Transportation (Non-Medical): No  Physical Activity: Insufficiently Active (02/19/2023)   Exercise Vital Sign    Days of Exercise per Week: 3 days    Minutes of Exercise per Session: 10 min  Stress: Stress Concern Present (02/19/2023)   [employer] of Occupational Health - Occupational Stress Questionnaire    Feeling of Stress : Very much  Social Connections:  Moderately Integrated (02/19/2023)   Social Connection and Isolation Panel [NHANES]    Frequency of Communication with Friends and Family: Three times a week    Frequency of Social Gatherings with Friends and Family: Once a week    Attends Religious Services: More than 4 times per year    Active Member of [employer] or Organizations: No    Attends Banker Meetings: Never    Marital Status: Living with partner  Intimate Partner Violence: Not At Risk (02/19/2023)   Humiliation, Afraid, Rape, and Kick questionnaire    Fear of Current or Ex-Partner: No    Emotionally Abused: No    Physically Abused: No    Sexually Abused: No    Outpatient Medications Prior to Visit  Medication Sig Dispense Refill   amLODipine  (NORVASC ) 10 MG tablet Take 1 tablet (10 mg total) by mouth daily. 90 tablet 0   BIKTARVY  50-200-25 MG TABS tablet TAKE 1 TABLET BY MOUTH DAILY 30 tablet 6    cetirizine (ZYRTEC) 10 MG tablet Take 10 mg by mouth daily.     clobetasol  cream (TEMOVATE ) 0.05 % Apply 1 application. topically 2 (two) times daily. 30 g 3   fluticasone  (FLONASE ) 50 MCG/ACT nasal spray Place 1 spray into both nostrils daily. 9.9 g 0   indapamide  (LOZOL ) 1.25 MG tablet Take 1 tablet (1.25 mg total) by mouth daily. 90 tablet 0   modafinil  (PROVIGIL ) 200 MG tablet Take 1 tablet (200 mg total) by mouth daily. 30 tablet 5   traZODone  (DESYREL ) 50 MG tablet TAKE 1 TABLET BY MOUTH EVERY NIGHT AT BEDTIME 30 tablet 2   No facility-administered medications prior to visit.    No Known Allergies  Review of Systems  Constitutional:  Negative for chills and fever.  HENT:  Positive for ear pain. Negative for congestion, ear discharge, sinus pain, sore throat and tinnitus.   Respiratory:  Negative for shortness of breath.   Cardiovascular:  Negative for chest pain, palpitations and leg swelling.  Gastrointestinal:  Negative for abdominal pain, constipation, diarrhea, nausea and vomiting.  Genitourinary:  Negative for dysuria, frequency and urgency.  Neurological:  Negative for dizziness, focal weakness and headaches.       Objective:    Physical Exam Constitutional:      General: He is not in acute distress.    Appearance: He is not ill-appearing.  HENT:     Head:     Jaw: No tenderness or pain on movement.     Salivary Glands: Left salivary gland is not diffusely enlarged or tender.     Right Ear: Tympanic membrane, ear canal and external ear normal.     Left Ear: Tympanic membrane normal. Swelling and tenderness present. No mastoid tenderness.     Ears:     Comments: Left ear canal with erythema, pustules anteriorly and edema. No lymphadenopathy. TTP around ear.     Nose: Nose normal.     Mouth/Throat:     Mouth: Mucous membranes are moist.     Pharynx: Oropharynx is clear.  Eyes:     Extraocular Movements: Extraocular movements intact.     Conjunctiva/sclera:  Conjunctivae normal.  Cardiovascular:     Rate and Rhythm: Normal rate and regular rhythm.  Pulmonary:     Effort: Pulmonary effort is normal.     Breath sounds: Normal breath sounds.  Musculoskeletal:     Cervical back: Normal range of motion and neck supple. No tenderness.  Lymphadenopathy:     Cervical:  No cervical adenopathy.  Skin:    General: Skin is warm and dry.     Findings: No rash.  Neurological:     General: No focal deficit present.     Mental Status: He is alert and oriented to person, place, and time.  Psychiatric:        Mood and Affect: Mood normal.        Behavior: Behavior normal.        Thought Content: Thought content normal.      BP 132/86 (BP Location: Left Arm, Patient Position: Sitting, Cuff Size: Large)   Pulse 94   Temp 97.6 F (36.4 C) (Temporal)   Ht 5' 9 (1.753 m)   Wt 291 lb (132 kg)   SpO2 96%   BMI 42.97 kg/m  Wt Readings from Last 3 Encounters:  03/10/23 291 lb (132 kg)  01/15/23 291 lb (132 kg)  12/30/22 286 lb (129.7 kg)       Assessment & Plan:   Problem List Items Addressed This Visit     HIV disease (HCC)   Relevant Orders   T-helper cells (CD4) count (Completed)   Hypertriglyceridemia   Relevant Orders   Lipid panel (Completed)   Other Visit Diagnoses       Acute otitis externa of left ear, unspecified type    -  Primary   Relevant Medications   levofloxacin  (LEVAQUIN ) 500 MG tablet   Other Relevant Orders   CBC with Differential/Platelet (Completed)     Primary hypertension       Relevant Orders   CBC with Differential/Platelet (Completed)   Comprehensive metabolic panel (Completed)      Check CD4 count. Treat with topical Ciprodex  and oral Levaquin  in case of early malignant otitis externa due to immunocompromised status with HIV. He will follow-up in 2 days to ensure improvement in symptoms and I should have his lab results back. Hypertension-controlled.  Continue current medications.  Continue low-sodium  diet. HLD-check fasting lipids and follow-up.  Encouraged low-fat, low-cholesterol diet.   I am having Keith Blackwell maintain his cetirizine, fluticasone , clobetasol  cream, traZODone , amLODipine , indapamide , Biktarvy , modafinil , ciprofloxacin -dexamethasone , and levofloxacin .  Meds ordered this encounter  Medications   DISCONTD: ciprofloxacin -dexamethasone  (CIPRODEX ) OTIC suspension    Sig: Place 4 drops into the left ear 2 (two) times daily.    Dispense:  7.5 mL    Refill:  0    Supervising Provider:   ROLLENE NORRIS A [[id number]]   DISCONTD: levofloxacin  (LEVAQUIN ) 500 MG tablet    Sig: Take 1 tablet (500 mg total) by mouth daily for 7 days.    Dispense:  7 tablet    Refill:  0    Supervising Provider:   ROLLENE NORRIS A [[id number]]   ciprofloxacin -dexamethasone  (CIPRODEX ) OTIC suspension    Sig: Place 4 drops into the left ear 2 (two) times daily.    Dispense:  7.5 mL    Refill:  0   levofloxacin  (LEVAQUIN ) 500 MG tablet    Sig: Take 1 tablet (500 mg total) by mouth daily for 7 days.    Dispense:  7 tablet    Refill:  0

## 2023-03-11 LAB — T-HELPER CELLS (CD4) COUNT (NOT AT ARMC)
Absolute CD4: 356 {cells}/uL — ABNORMAL LOW (ref 490–1740)
CD4 T Helper %: 19 % — ABNORMAL LOW (ref 30–61)
Total lymphocyte count: 1919 {cells}/uL (ref 850–3900)

## 2023-03-12 ENCOUNTER — Encounter: Payer: Self-pay | Admitting: Family Medicine

## 2023-03-12 ENCOUNTER — Ambulatory Visit: Payer: Medicaid Other | Admitting: Family Medicine

## 2023-03-12 VITALS — BP 124/86 | HR 97 | Temp 97.8°F | Ht 69.0 in | Wt 291.0 lb

## 2023-03-12 DIAGNOSIS — H60502 Unspecified acute noninfective otitis externa, left ear: Secondary | ICD-10-CM | POA: Diagnosis not present

## 2023-03-12 DIAGNOSIS — E7800 Pure hypercholesterolemia, unspecified: Secondary | ICD-10-CM | POA: Diagnosis not present

## 2023-03-12 DIAGNOSIS — R944 Abnormal results of kidney function studies: Secondary | ICD-10-CM

## 2023-03-12 NOTE — Patient Instructions (Signed)
 Continue the current topical and oral antibiotic for your ear infection.  Cut back on foods high in saturated fat, fried foods and processed foods to help lower your cholesterol.  Your kidney function is abnormal.  Make sure you are staying well-hydrated.  Follow-up in 4 months fasting but well-hydrated so we can recheck your labs.

## 2023-03-12 NOTE — Progress Notes (Signed)
 Subjective:  Keith Blackwell is a 38 y.o. male who presents for a 48 hour follow up on acute otitis externa of left ear.  Reports decreased pain in his left ear.  He is taking oral Levaquin  and topical Ciprodex .  Also here to follow-up on abnormal lab values.  Elevated LDL and triglycerides.  He does report that he eats fried foods often.  Decreased kidney function.  He admits to not being well-hydrated.    ROS as in subjective.   Objective: Vitals:   03/12/23 1525  BP: 124/86  Pulse: 97  Temp: 97.8 F (36.6 C)  SpO2: 98%    General appearance: Alert, WD/WN, no distress                             Skin: warm, no rash              Eyes: conjunctiva normal, corneas clear, PERRLA       Ears: left TM normal, left ear canal without pustules,  improved erythema and edema                Neck: supple, no adenopathy, no thyromegaly, nontender                          Heart: RRR                         Lungs: respirations unlabored      Assessment: Acute otitis externa of left ear, unspecified type  Decreased GFR  Pure hypercholesterolemia   Plan: Left ear improving.  He will complete the course of oral and topical antibiotics. Reviewed labs from 2 days ago.  Counseling on eating a diet low in saturated fat and cholesterol and eating more grilled baked foods as well as fruits and vegetables.  We also discussed staying better hydrated to help his kidney function.  Keep blood pressure under good control.  He will follow-up here fasting in 4 months or sooner if needed.

## 2023-03-14 ENCOUNTER — Other Ambulatory Visit: Payer: Self-pay | Admitting: Family Medicine

## 2023-03-14 DIAGNOSIS — I10 Essential (primary) hypertension: Secondary | ICD-10-CM

## 2023-03-17 ENCOUNTER — Ambulatory Visit (HOSPITAL_COMMUNITY): Payer: Medicaid Other | Admitting: Physician Assistant

## 2023-03-24 ENCOUNTER — Ambulatory Visit: Payer: Medicaid Other | Admitting: Neurology

## 2023-03-24 DIAGNOSIS — R0683 Snoring: Secondary | ICD-10-CM

## 2023-03-24 DIAGNOSIS — G4733 Obstructive sleep apnea (adult) (pediatric): Secondary | ICD-10-CM | POA: Diagnosis not present

## 2023-03-24 DIAGNOSIS — G4726 Circadian rhythm sleep disorder, shift work type: Secondary | ICD-10-CM

## 2023-03-24 DIAGNOSIS — G4719 Other hypersomnia: Secondary | ICD-10-CM
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
## 2023-03-31 ENCOUNTER — Encounter: Payer: Self-pay | Admitting: Neurology

## 2023-03-31 DIAGNOSIS — G4733 Obstructive sleep apnea (adult) (pediatric): Secondary | ICD-10-CM | POA: Insufficient documentation

## 2023-03-31 NOTE — Addendum Note (Signed)
 Addended by: Melvyn Novas on: 03/31/2023 10:11 AM   Modules accepted: Orders
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
## 2023-04-01 NOTE — Telephone Encounter (Signed)
 I called pt. I advised pt that Dr. Vickey Huger reviewed their sleep study results and found that pt has severe osa. Dr. Vickey Huger recommends that pt starts auto CPAP. I reviewed PAP compliance expectations with the pt. Pt is agreeable to starting a CPAP. I advised pt that an order will be sent to a DME, [facility], and [facility] will call the pt within about one week after they file with the pt's insurance. [facility] will show the pt how to use the machine, fit for masks, and troubleshoot the CPAP if needed. A follow up appt was made for insurance purposes with Dr. Vickey Huger on 06/11/2023 at 8:30 am. Pt verbalized understanding to arrive 15 minutes early and bring their CPAP. Pt verbalized understanding of results. Pt had no questions at this time but was encouraged to call back if questions arise. I have sent the order to [facility] and have received confirmation that they have received the order.

## 2023-04-01 NOTE — Telephone Encounter (Signed)
-----   Message from [place] Dohmeier sent at 03/31/2023 10:11 AM EST ----- Following AASM criteria, with total AHI was 82.5/h which is extremely high.  The REM sleep AHI was 75.7 and the non-REM REM sleep AHI was 84/h.     O2 Saturation Range (%):   Between the nadir at 51 and a maximum of 99%-this nadir is critically low.                                    O2 Saturation (minutes) <89%: 167 minutes 42% of the total recorded sleep time for spent in hypoxia.  This is extremely significant        Pulse Range:    Between 42 beats per minute and the maximum of 157 bpm.,          This HST confirms the presence of most severe obstructive sleep apnea associated with severe oxygen desaturation persistent for almost 3 hours.   RECOMMENDATION: I would want Keith Blackwell to start immediately on CPAP but it may not be enough to control the hypoxemia or the total of apnea. I will order an auto titration CPAP device between 6 and 7 and 20 cm water pressure, 3 cm EPR, heated humidification and a fullface mask.  I will ask the DME to add an overnight pulse oximetry on day 14 of CPAP use.  We have a follow-up visit in March and I would like the data to be available then.   I will consider an in lab titration study to follow should the auto CPAP not show significant effect in treatment of apnea and hypoxia.

## 2023-04-30 ENCOUNTER — Ambulatory Visit: Payer: Medicaid Other | Admitting: Neurology

## 2023-04-30 ENCOUNTER — Encounter: Payer: Self-pay | Admitting: Neurology

## 2023-05-04 ENCOUNTER — Ambulatory Visit: Payer: Self-pay

## 2023-05-04 ENCOUNTER — Encounter: Payer: Self-pay | Admitting: Family Medicine

## 2023-05-04 ENCOUNTER — Ambulatory Visit: Admitting: Family Medicine

## 2023-05-04 VITALS — BP 128/72 | HR 98 | Temp 98.3°F | Resp 18 | Ht 69.0 in | Wt 295.0 lb

## 2023-05-04 DIAGNOSIS — J0140 Acute pansinusitis, unspecified: Secondary | ICD-10-CM

## 2023-05-04 DIAGNOSIS — I10 Essential (primary) hypertension: Secondary | ICD-10-CM

## 2023-05-04 MED ORDER — FLUTICASONE PROPIONATE 50 MCG/ACT NA SUSP: 1.0000 | Freq: Every day | NASAL | 0 refills | Status: DC

## 2023-05-04 MED ORDER — AMOXICILLIN-POT CLAVULANATE 875-125 MG PO TABS: 1.0000 | ORAL_TABLET | Freq: Two times a day (BID) | ORAL | 0 refills | Status: AC

## 2023-05-04 MED ORDER — AMLODIPINE BESYLATE 10 MG PO TABS: 10.0000 mg | ORAL_TABLET | Freq: Every day | ORAL | 0 refills | Status: DC

## 2023-05-04 NOTE — Telephone Encounter (Signed)
   Chief Complaint: eye swelling Symptoms: bilateral eye swelling, congestion, runny nose Frequency: worsening over the last week Pertinent Negatives: Patient denies fever, sob, vision changes Disposition: [] ED /[] Urgent Care (no appt availability in office) / [x] Appointment(In office/virtual)/ []  [facility]/ [] Home Care/ [] Refused Recommended Disposition /[] [facility]/ []  Follow-up with PCP Additional Notes: Patient reports he has been experiencing worsening eye swelling, congestion, and runny nose over the past week. Patient reports he does have a history of allergies but that he feels "really bad". Patient reports eyes are itching, tender, red, and draining. Patient reports nasal drainage is yellow and green. Per protocol, appt scheduled today 3/31. Patient advised to call with worsening symptoms. Patient verbalized understanding.     Copied from CRM [id number]. Topic: Clinical - Red Word Triage >> May 04, 2023 11:06 AM Drema Balzarine wrote: Red Word that prompted transfer to Nurse Triage: Patient requested appointment, says allergies are acting up, both eyes are swollen, zyrtec is not working, he's been having congestion and can't breath Reason for Disposition  [1] SEVERE eyelid swelling on one side AND [2] sinus pain or pressure  Answer Assessment - Initial Assessment Questions 1. ONSET: "When did the swelling start?" (e.g., minutes, hours, days)     Worsening over the last week 2. LOCATION: "What part of the eyelids is swollen?"     eyelids 3. SEVERITY: "How swollen is it?"     Swollen shut in the morning, but can see during the day once clean 4. ITCHING: "Is there any itching?" If Yes, ask: "How much?"   (Scale 1-10; mild, moderate or severe)     severe 5. PAIN: "Is the swelling painful to touch?" If Yes, ask: "How painful is it?"   (Scale 1-10; mild, moderate or severe)     mild 6. FEVER: "Do you have a fever?" If Yes, ask: "What is it, how was it measured, and  when did it start?"      none 7. CAUSE: "What do you think is causing the swelling?"     allergies 8. RECURRENT SYMPTOM: "Have you had eyelid swelling before?" If Yes, ask: "When was the last time?" "What happened that time?"     allergies 9. OTHER SYMPTOMS: "Do you have any other symptoms?" (e.g., blurred vision, eye discharge, rash, runny nose)     Runny nose, congestion  Protocols used: Eye - Swelling-A-AH

## 2023-05-04 NOTE — Progress Notes (Signed)
 Assessment & Plan:  1. Acute non-recurrent pansinusitis (Primary) Education provided on sinus infections. Continue Zyrtec. - amoxicillin-clavulanate (AUGMENTIN) 875-125 MG tablet; Take 1 tablet by mouth 2 (two) times daily for 7 days.  Dispense: 14 tablet; Refill: 0 - fluticasone (FLONASE) 50 MCG/ACT nasal spray; Place 1 spray into both nostrils daily.  Dispense: 16 g; Refill: 0  2. Primary hypertension Well controlled on current regimen. Encouraged to keep follow-up with PCP in June.  - amLODipine (NORVASC) 10 MG tablet; Take 1 tablet (10 mg total) by mouth daily.  Dispense: 90 tablet; Refill: 0  No results found for any visits on 05/04/23.  Follow up plan: Return for as scheduled with PCP.  Deliah Boston, MSN, APRN, FNP-C  Subjective:  HPI: Keith Blackwell is a 38 y.o. male presenting on 05/04/2023 for Allergies (Facial pressure, nasal drainage, chest congestion, eyes matted in AM, and draining throughout day, occ fever, ST, HA/Wears CPAP at night - harder to breathe )  Patient complains of head/chest congestion, headache, runny nose, sore throat, facial pain/pressure, fever, postnasal drainage, and matting of the eyes in the morning . Onset of symptoms was  a  month ago, rapidly worsening in the past two weeks. He is drinking plenty of fluids. Evaluation to date: none. Treatment to date: antihistamines for the past month. He does smoke.    ROS: Negative unless specifically indicated above in HPI.   Relevant past medical history reviewed and updated as indicated.   Allergies and medications reviewed and updated.   Current Outpatient Medications:    amLODipine (NORVASC) 10 MG tablet, Take 1 tablet (10 mg total) by mouth daily., Disp: 90 tablet, Rfl: 0   BIKTARVY 50-200-25 MG TABS tablet, TAKE 1 TABLET BY MOUTH DAILY, Disp: 30 tablet, Rfl: 6   cetirizine (ZYRTEC) 10 MG tablet, Take 10 mg by mouth daily., Disp: , Rfl:    indapamide (LOZOL) 1.25 MG tablet, TAKE 1 TABLET BY MOUTH  DAILY., Disp: 90 tablet, Rfl: 0   fluticasone (FLONASE) 50 MCG/ACT nasal spray, Place 1 spray into both nostrils daily. (Patient not taking: Reported on 05/04/2023), Disp: 9.9 g, Rfl: 0  No Known Allergies  Objective:   BP 128/72   Pulse 98   Temp 98.3 F (36.8 C)   Resp 18   Ht 5\' 9"  (1.753 m)   Wt 295 lb (133.8 kg)   SpO2 97%   BMI 43.56 kg/m    Physical Exam Vitals reviewed.  Constitutional:      General: He is not in acute distress.    Appearance: Normal appearance. He is not ill-appearing, toxic-appearing or diaphoretic.  HENT:     Head: Normocephalic and atraumatic.     Right Ear: Ear canal and external ear normal. There is no impacted cerumen. Tympanic membrane is bulging.     Left Ear: Ear canal and external ear normal. There is no impacted cerumen. Tympanic membrane is bulging.     Nose: Congestion present.     Right Sinus: Maxillary sinus tenderness and frontal sinus tenderness present.     Left Sinus: Maxillary sinus tenderness and frontal sinus tenderness present.     Mouth/Throat:     Mouth: Mucous membranes are moist.     Pharynx: Oropharynx is clear. No oropharyngeal exudate or posterior oropharyngeal erythema.     Tonsils: No tonsillar exudate or tonsillar abscesses.  Eyes:     General: No scleral icterus.       Right eye: No discharge.  Left eye: No discharge.     Conjunctiva/sclera: Conjunctivae normal.  Cardiovascular:     Rate and Rhythm: Normal rate.  Pulmonary:     Effort: Pulmonary effort is normal. No respiratory distress.  Musculoskeletal:        General: Normal range of motion.     Cervical back: Normal range of motion.  Lymphadenopathy:     Cervical: No cervical adenopathy.  Skin:    General: Skin is warm and dry.  Neurological:     Mental Status: He is alert and oriented to person, place, and time. Mental status is at baseline.  Psychiatric:        Mood and Affect: Mood normal.        Behavior: Behavior normal.        Thought  Content: Thought content normal.        Judgment: Judgment normal.

## 2023-06-11 ENCOUNTER — Encounter: Payer: Self-pay | Admitting: Neurology

## 2023-06-11 ENCOUNTER — Ambulatory Visit: Payer: Medicaid Other | Admitting: Neurology

## 2023-06-21 ENCOUNTER — Other Ambulatory Visit: Payer: Self-pay | Admitting: Family Medicine

## 2023-06-21 DIAGNOSIS — I10 Essential (primary) hypertension: Secondary | ICD-10-CM

## 2023-06-23 ENCOUNTER — Ambulatory Visit: Payer: Medicaid Other | Admitting: Internal Medicine

## 2023-07-08 ENCOUNTER — Other Ambulatory Visit: Payer: Self-pay | Admitting: Family Medicine

## 2023-07-08 DIAGNOSIS — J0140 Acute pansinusitis, unspecified: Secondary | ICD-10-CM

## 2023-07-15 ENCOUNTER — Ambulatory Visit: Payer: Self-pay | Admitting: Internal Medicine

## 2023-07-15 ENCOUNTER — Telehealth: Payer: Self-pay

## 2023-07-15 NOTE — Telephone Encounter (Signed)
 Called patient to reschedule missed appointment no answer, left HIPAA compliant vm to contact office.

## 2023-07-22 ENCOUNTER — Encounter: Payer: Self-pay | Admitting: Family Medicine

## 2023-07-22 ENCOUNTER — Ambulatory Visit: Payer: Medicaid Other | Admitting: Family Medicine

## 2023-07-22 VITALS — BP 130/84 | HR 91 | Temp 97.9°F | Ht 69.0 in | Wt 299.0 lb

## 2023-07-22 DIAGNOSIS — H6691 Otitis media, unspecified, right ear: Secondary | ICD-10-CM

## 2023-07-22 DIAGNOSIS — F17200 Nicotine dependence, unspecified, uncomplicated: Secondary | ICD-10-CM

## 2023-07-22 DIAGNOSIS — B20 Human immunodeficiency virus [HIV] disease: Secondary | ICD-10-CM

## 2023-07-22 DIAGNOSIS — N189 Chronic kidney disease, unspecified: Secondary | ICD-10-CM

## 2023-07-22 DIAGNOSIS — R109 Unspecified abdominal pain: Secondary | ICD-10-CM

## 2023-07-22 DIAGNOSIS — E7800 Pure hypercholesterolemia, unspecified: Secondary | ICD-10-CM | POA: Diagnosis not present

## 2023-07-22 DIAGNOSIS — R197 Diarrhea, unspecified: Secondary | ICD-10-CM

## 2023-07-22 DIAGNOSIS — I10 Essential (primary) hypertension: Secondary | ICD-10-CM | POA: Diagnosis not present

## 2023-07-22 DIAGNOSIS — N644 Mastodynia: Secondary | ICD-10-CM

## 2023-07-22 DIAGNOSIS — E781 Pure hyperglyceridemia: Secondary | ICD-10-CM

## 2023-07-22 DIAGNOSIS — G4733 Obstructive sleep apnea (adult) (pediatric): Secondary | ICD-10-CM

## 2023-07-22 LAB — COMPREHENSIVE METABOLIC PANEL WITH GFR
ALT: 29 U/L (ref 0–53)
AST: 27 U/L (ref 0–37)
Albumin: 4.5 g/dL (ref 3.5–5.2)
Alkaline Phosphatase: 63 U/L (ref 39–117)
BUN: 11 mg/dL (ref 6–23)
CO2: 28 meq/L (ref 19–32)
Calcium: 9.4 mg/dL (ref 8.4–10.5)
Chloride: 103 meq/L (ref 96–112)
Creatinine, Ser: 1.28 mg/dL (ref 0.40–1.50)
GFR: 71.6 mL/min (ref >60.00)
Glucose, Bld: 93 mg/dL (ref 70–99)
Potassium: 4 meq/L (ref 3.5–5.1)
Sodium: 138 meq/L (ref 135–145)
Total Bilirubin: 0.7 mg/dL (ref 0.2–1.2)
Total Protein: 8.2 g/dL (ref 6.0–8.3)

## 2023-07-22 LAB — MAGNESIUM: Magnesium: 2.1 mg/dL (ref 1.5–2.5)

## 2023-07-22 LAB — CBC WITH DIFFERENTIAL/PLATELET
Basophils Absolute: 0 10*3/uL (ref 0.0–0.1)
Basophils Relative: 0.5 % (ref 0.0–3.0)
Eosinophils Absolute: 0.1 10*3/uL (ref 0.0–0.7)
Eosinophils Relative: 2.6 % (ref 0.0–5.0)
HCT: 43.6 % (ref 39.0–52.0)
Hemoglobin: 13.8 g/dL (ref 13.0–17.0)
Lymphocytes Relative: 31.2 % (ref 12.0–46.0)
Lymphs Abs: 1.4 10*3/uL (ref 0.7–4.0)
MCHC: 31.6 g/dL (ref 30.0–36.0)
MCV: 77.6 fl — ABNORMAL LOW (ref 78.0–100.0)
Monocytes Absolute: 0.4 10*3/uL (ref 0.1–1.0)
Monocytes Relative: 8.1 % (ref 3.0–12.0)
Neutro Abs: 2.6 10*3/uL (ref 1.4–7.7)
Neutrophils Relative %: 57.6 % (ref 43.0–77.0)
Platelets: 207 10*3/uL (ref 150.0–400.0)
RBC: 5.62 Mil/uL (ref 4.22–5.81)
RDW: 15.1 % (ref 11.5–15.5)
WBC: 4.5 10*3/uL (ref 4.0–10.5)

## 2023-07-22 LAB — LIPID PANEL
Cholesterol: 166 mg/dL (ref 0–200)
HDL: 29.1 mg/dL — ABNORMAL LOW (ref >39.00)
LDL Cholesterol: 104 mg/dL — ABNORMAL HIGH (ref 0–99)
NonHDL: 137.12
Total CHOL/HDL Ratio: 6
Triglycerides: 166 mg/dL — ABNORMAL HIGH (ref 0.0–149.0)
VLDL: 33.2 mg/dL (ref 0.0–40.0)

## 2023-07-22 LAB — TSH: TSH: 0.8 u[IU]/mL (ref 0.35–5.50)

## 2023-07-22 LAB — LIPASE: Lipase: 25 U/L (ref 11.0–59.0)

## 2023-07-22 LAB — HEMOGLOBIN A1C: Hgb A1c MFr Bld: 5.5 % (ref 4.6–6.5)

## 2023-07-22 MED ORDER — AMOXICILLIN-POT CLAVULANATE 875-125 MG PO TABS: 1.0000 | ORAL_TABLET | Freq: Two times a day (BID) | ORAL | 0 refills | Status: DC

## 2023-07-22 NOTE — Patient Instructions (Addendum)
 Please go downstairs for labs before you leave. I also ordered a stool study and you will get the kit to get this done.   I ordered an ultrasound of your right breast.  You will receive a call to get this scheduled.  For now, try taking over-the-counter Tylenol  500 mg or 1000 mg once or twice daily.  You can use a cool or warm compress to help with pain as well.   Take the antibiotic for your right ear infection.  Take it with food.  Continue monitoring your blood pressure and taking your current medications.  Follow-up with ID as recommended.  Call and get a new mask for your CPAP as you mentioned.   Let me know if you have any new or worsening symptoms and I will be in touch with your results.
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
## 2023-07-23 ENCOUNTER — Ambulatory Visit: Payer: Self-pay | Admitting: Family Medicine

## 2023-07-23 DIAGNOSIS — R109 Unspecified abdominal pain: Secondary | ICD-10-CM | POA: Insufficient documentation

## 2023-07-23 DIAGNOSIS — N644 Mastodynia: Secondary | ICD-10-CM | POA: Insufficient documentation

## 2023-07-23 DIAGNOSIS — H6691 Otitis media, unspecified, right ear: Secondary | ICD-10-CM | POA: Insufficient documentation

## 2023-07-23 DIAGNOSIS — R197 Diarrhea, unspecified: Secondary | ICD-10-CM | POA: Insufficient documentation

## 2023-07-23 NOTE — Assessment & Plan Note (Signed)
 Counseling on low-fat diet, low-cholesterol diet.  Check fasting lipids and follow-up

## 2023-07-23 NOTE — Assessment & Plan Note (Signed)
 Counseling on health consequences of obesity.  Check labs to look for complications.  Counseling on diet and exercise.  Use CPAP.

## 2023-07-23 NOTE — Assessment & Plan Note (Signed)
 This is a new problem that he noticed approximately 3 weeks ago.  Significant TTP on exam.  No discrete mass palpated.  No sign of infection. right breast ultrasound ordered.

## 2023-07-23 NOTE — Assessment & Plan Note (Signed)
 Discussed the need for good hypertension control and smoking cessation.  Stay hydrated.  Continue to monitor renal function.  Consider adding ACE inhibitor or ARB.

## 2023-07-23 NOTE — Assessment & Plan Note (Signed)
 Encouraged follow-up with ID

## 2023-07-23 NOTE — Assessment & Plan Note (Signed)
 Blood pressure better controlled.  Continue current medication regimen and monitoring at home.  Continue low-sodium diet.  Check BMP.

## 2023-07-23 NOTE — Assessment & Plan Note (Signed)
 Augmentin  prescribed.  Reviewed notes from previous visit.  You may take Tylenol  for pain as needed.  Continue allergy medication and steroid nasal spray.  Follow-up if not improving in the next 3 to 4 days or if worsening.

## 2023-07-23 NOTE — Assessment & Plan Note (Signed)
 Associated with a 2-week history of nausea, vomiting and diarrhea.  No red flag symptoms.  Check labs and stool study and follow-up.  He will let me know if his symptoms are worsening.

## 2023-07-23 NOTE — Assessment & Plan Note (Signed)
 Reports using CPAP but not nightly due to difficulties with his mask.  He will contact [facility] to discuss a new mask.

## 2023-07-23 NOTE — Assessment & Plan Note (Signed)
 Ongoing for 2 weeks with some improvement.  Started while he was at [employer] and trying new foods.  Check labs to look for electrolyte derangement.  Check stool study.  Discussed staying well-hydrated.  Advised to seek immediate medical care if he develops fever, severe abdominal pain or if he has blood in his stool.  Follow-up pending results.

## 2023-07-23 NOTE — Assessment & Plan Note (Signed)
 Discussed potential health consequences associated with smoking.  Encouraged him to work on a plan to stop smoking.

## 2023-08-12 ENCOUNTER — Other Ambulatory Visit: Payer: Self-pay | Admitting: Internal Medicine

## 2023-08-12 DIAGNOSIS — B20 Human immunodeficiency virus [HIV] disease: Secondary | ICD-10-CM

## 2023-09-07 ENCOUNTER — Other Ambulatory Visit: Payer: Self-pay | Admitting: Internal Medicine

## 2023-09-07 DIAGNOSIS — B20 Human immunodeficiency virus [HIV] disease: Secondary | ICD-10-CM

## 2023-10-06 ENCOUNTER — Other Ambulatory Visit: Payer: Self-pay | Admitting: Internal Medicine

## 2023-10-06 DIAGNOSIS — B20 Human immunodeficiency virus [HIV] disease: Secondary | ICD-10-CM

## 2023-10-08 ENCOUNTER — Other Ambulatory Visit: Payer: Self-pay | Admitting: Family Medicine

## 2023-10-08 DIAGNOSIS — I10 Essential (primary) hypertension: Secondary | ICD-10-CM

## 2023-11-03 ENCOUNTER — Encounter: Payer: Self-pay | Admitting: Internal Medicine

## 2023-11-03 ENCOUNTER — Other Ambulatory Visit: Payer: Self-pay

## 2023-11-03 ENCOUNTER — Ambulatory Visit (INDEPENDENT_AMBULATORY_CARE_PROVIDER_SITE_OTHER): Admitting: Internal Medicine

## 2023-11-03 VITALS — BP 151/87 | HR 81 | Temp 98.4°F | Wt 298.0 lb

## 2023-11-03 DIAGNOSIS — Z111 Encounter for screening for respiratory tuberculosis: Secondary | ICD-10-CM

## 2023-11-03 DIAGNOSIS — B20 Human immunodeficiency virus [HIV] disease: Secondary | ICD-10-CM | POA: Diagnosis not present

## 2023-11-03 DIAGNOSIS — T148XXA Other injury of unspecified body region, initial encounter: Secondary | ICD-10-CM

## 2023-11-03 DIAGNOSIS — Z113 Encounter for screening for infections with a predominantly sexual mode of transmission: Secondary | ICD-10-CM

## 2023-11-03 LAB — T-HELPER CELL (CD4) - (RCID CLINIC ONLY)
CD4 % Helper T Cell: 19 % — ABNORMAL LOW (ref 33–65)
CD4 T Cell Abs: 287 /uL — ABNORMAL LOW (ref 400–1790)

## 2023-11-03 MED ORDER — BICTEGRAVIR-EMTRICITAB-TENOFOV 50-200-25 MG PO TABS: 1.0000 | ORAL_TABLET | Freq: Every day | ORAL | 11 refills | Status: AC

## 2023-11-03 NOTE — Patient Instructions (Signed)
 Chest strain - Can take ibuprofen  or tylenol  rather than aspirin (risk for bleeding) Stretch before activity Ice/heat as needed   Continue biktarvy    Labs today   See you in 1 year

## 2023-11-03 NOTE — Progress Notes (Signed)
 [facility]    Cc- hiv tx follow up   HPI: Keith Blackwell is a 38 y.o. male dx'ed hiv 2022, here for f/u routine care   Initial visit with me 05/2021: ------------ He came to the ED for an itchy rash on 4/10; given steroid cream. Tested for rpr and hiv as well  He was notified a day ago about the positive hiv test He has been with one male partner 3 years -- she is getting testing as of a day prior to this visit  He reports he noticed the rash in September 2022 (first time) -- intensely itch plaque/papular-squamous on in extensors side of bilateral elbows, around the belly button, and also popliteal fossa of the right knee. His grandmother does have psoriasis. No genital lesion   No sign/sx of recent viral illness within the 3 months prior to the hiv test. His last hiv testing was 2019 and was negative. He also have never had positive rpr in the past (no hx syphilis told)  Patient been feeling well otherwise without any abnormal sign/symptoms of concern outside of the rash (denies fever, chill, nightsweat, weightloss, decreased appetite, cough, chest pain, abd pain, n/v/diarrhea, penile discharge, lymph node swelling, headache, numbness/tingling, visual changes)  Social: -moved to [place], but [place]born in [place] -lives with grandmother -GED education level -not working right now -In a relationship of 3 years -- heterosexual relationship -marijuanna smoking; tobacco since age 34, currently about a ppd; no ivdu or other substance  We talked about the new merk study vs biktarvy  -- he is interested and [place] with the research group will talk to him more this visit  He has never been on any hiv drug including PrEP  -------------------- 07/08/21 id f/u    His girlfriend tested 3-4 times ([facility]) and was negative. 3 years monogamous relationship   His last negative test was 08/2017  right before he knew his girlfriend  Takes biktarvy  daily without missed dose  Feels depressed asked about antidepression. No SI/HI. Sleeping is terrible with regard to falling asleep since the diagnosis...  No other complaint   The rash he had previously is gone with topical steroid     08/13/21 id clinic f/u Patient compliant with biktarvy ; no missed dose Still complaining of inability to fall asleep, and maintaining sleep He has been trying ambien  and melatonin don't remember dose (got the latter otc) but they don't seem to help He is feeling less anxious and depressed Patient is currently not working No si/hi Eating well but it comes and goes at time  Still with his girlfriend, who had tested negative.  Asked why his blood pressure here is high. He doesn't check at home   11/13/21 id clinic f/u He complains of depression He also has been hypertensive when he comes 160s/100s Patient works night shift He still smokes He drinks on the weekend, but no binge Takes his hiv med everyday 9am He is still with his girlfriend He doesn't know what is causing his depression (he said he gets depressed when he thinks about the virus)   No hi/si. Some sleeping issue falling asleep after coming home from work. No anhedonia. No interelationship/work interference. No other stressors. No gun at home   05/15/22 id f/u Girlfriend taking prep, has hair falling out and wondering if she needs it. Patient has been < 50 since 08/2021 No missed dose  of biktarvy  last 4 weeks He is thinking about cabenuva and will talk to his girflriend more No plan to have kids by his girlfriend  No concern today     12/12/2022 id clinic f/u Lots of question today Prilosec otc recently started for reflux Been anxious since diagnosis and thinks it interfers with sleep, personal relationship and would like to discuss with psychiatry No missed dose biktarvy  last 4 weeks - wants to keep on this right  now In same relationship with his girlfriend No plan to have children at this time Girlfriend had stopped taking prep  No other complaint  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    11/03/23 id clinic f/u 4-5 weeks ago strain his left chest; overall better; taking aspirin which also helps No exertional dyspnea No other complaint No change in social situation     Review of Systems: ROS All other ros negative      Past Medical History:  Diagnosis Date   Environmental allergies    HIV (human immunodeficiency virus infection) (HCC)    HTN (hypertension)    Migraine    Pain in left knee 10/15/2017   Pain in right knee 12/22/2017   Strain of calf muscle 10/15/2017    Social History   Tobacco Use   Smoking status: Former    Current packs/day: 0.50    Average packs/day: 0.5 packs/day for 25.7 years (12.9 ttl pk-yrs)    Types: Cigarettes    Start date: 02/1998   Smokeless tobacco: Never  Vaping Use   Vaping status: Never Used  Substance Use Topics   Alcohol use: Yes    Comment: social events only   Drug use: Yes    Frequency: 7.0 times per week    Types: Marijuana    Comment: weekend use. 1/8th    Fam hx: Grandmother with psoriasis  No Known Allergies  OBJECTIVE: Vitals:   11/03/23 0840  BP: (!) 151/87  Pulse: 81  Temp: 98.4 F (36.9 C)  TempSrc: Oral  SpO2: 98%  Weight: 298 lb (135.2 kg)    Body mass index is 44.01 kg/m.   Physical Exam General/constitutional: no distress, pleasant HEENT: Normocephalic, PER, Conj Clear, EOMI, Oropharynx clear Neck supple CV: rrr no mrg Lungs: clear to auscultation, normal respiratory effort Abd: Soft, Nontender Ext: no edema Skin: No Rash Neuro: nonfocal MSK: no peripheral joint swelling/tenderness/warmth; back spines nontender             Lab: Lab Results  Component Value Date   WBC 4.5 07/22/2023   HGB 13.8 07/22/2023   HCT 43.6 07/22/2023   MCV 77.6 (L) 07/22/2023   PLT 207.0  07/22/2023   Last metabolic panel Lab Results  Component Value Date   GLUCOSE 93 07/22/2023   NA 138 07/22/2023   K 4.0 07/22/2023   CL 103 07/22/2023   CO2 28 07/22/2023   BUN 11 07/22/2023   CREATININE 1.28 07/22/2023   GFRNONAA >60 02/25/2021   CALCIUM 9.4 07/22/2023   PROT 8.2 07/22/2023   ALBUMIN 4.5 07/22/2023   BILITOT 0.7 07/22/2023   ALKPHOS 63 07/22/2023   AST 27 07/22/2023   ALT 29 07/22/2023   ANIONGAP 8 02/25/2021   HIV: 12/2022     25        /      354 11/2021   <20 08/2021     41 07/2021                  /  119 (8%) 05/2021    111k    05/2021 hiv genotype Comment: HIV Subtype: B ___________________________________________________________ Antiretroviral drugs      Resistance  Mutations Detected                           Predicted ___________________________________________________________                                 !   !       NRTIs                     !   ! ZDV (zidovudine or Retrovir)    ! NO!     ABC (abacavir or Ziagen)        ! NO!     ddI (didanosine or Videx)       ! NO!     3TC (lamivudine or Epivir)      ! NO!     FTC (emtricitabine or Emtriva)  ! NO!     d4T (stavudine or Zerit)        ! NO!     TDF (tenofovir or Viread)       ! NO!     ________________________________!___!______________________                                 !   !       NNRTIs                    !   ! ETR (etravirine or Intelence)   ! NO!     EFV (efavirenz or Sustiva)      ! NO!     NVP (nevirapine or Viramune)    ! NO!     RPV (rilpivirine or Edurant)    ! NO!     DOR (doravirine or Pifeltro)    ! NO!     ________________________________!___!______________________                                 !   !       PIs                       !   ! FPV (fos-amprenavir or Lexiva)  ! NO!     IDV (indinavir or Crixivan)     ! NO!     NFV (nelfinavir or Viracept)    ! NO!     SQV (saquinavir or Invirase)    ! NO!     LPV (lopinavir or Kaletra)      ! NO!     ATV  (atazanavir or Reyataz)     ! NO!     TPV (tipranavir or Aptivus)     ! NO!     DRV (darunavir or Prezista)     ! NO!                                     !   ! ________________________________!___!______________________ PRB = PROBABLE OR EMERGING RESISTANCE OTHER MUTATIONS DETECTED: RT GENE MUTATIONS: R211K PR GENE MUTATIONS: G16A,I62V,L63T,V77I ___________________________________________________________  Microbiology:  Serology:  Imaging:   Assessment/plan: Problem List Items Addressed This Visit     HIV disease (HCC) - Primary   Relevant Medications   bictegravir-emtricitabine-tenofovir AF (BIKTARVY ) 50-200-25 MG TABS tablet   Other Relevant Orders   HIV-1 RNA quant-no reflex-bld   T-helper cell (CD4)- ([facility] clinic only)   Other Visit Diagnoses       Screening examination for venereal disease         Screening-pulmonary TB       Relevant Orders   QuantiFERON-TB Gold Plus        #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 Explain somebody with receptor mutation might be more resistant in acquiring hiv. Explains that he might have gotten it in 08/2017 but didn't seroconvert yet (was just a hiv antibody test)  Explains to him he'll be almost living a normal life as long as he takes his biktarvy . He has been 100% compliant   05/15/22 he asked about cabenuva again/side effect. He seems interested, but will talk to his girlfriend again before deciding. His gf is taking prep and has some hair loss which she thinks is due to prep. I discussed there has been no sexual transmission when he is undetectable which he has been (<50) since 08/2021.   11/03/23 doing well no missed dose. Wants to keep on biktarvy     -discussed u=u -encourage compliance -continue current HIV medication -labs today -f/u 12 months    #chest strain -continued conservative  care -no red flag -f/u as needed    #syphilis #std screen Rpr nonreactive and fta reactive. 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 Remains nonreactive rpr 11/2021  -deferred today 12/2022 at his request; same relationship/closed   #anxiety disorder #insomnia Counsel patient on timing/dx of depression and allowing time for his body to adjust He is having depressed mood and falling asleep issue but no full blown depression  Trazodone  working well for him 100 at bedtime  -referral to psychiatry for consideration of anxiolytics; no panic attack but generalized anziety with personal relationship/sleep disturbance   #hcm -vaccinations utd -hepatitis screening 12/12/22 hep b sAb 38; hep b vaccine responder -std screening  Gc/chlam and syphilis negative 05/13/2021 Fta reactive 07/2021; see syphilis hx above 11/03/23 didn't want std testing; monogamous relationship -tb screening Quantiferon gold ordered 11/03/23 -cancer screening N/a      Follow-up: No follow-ups on file.  Constance ONEIDA Passer, MD [facility] [phone] pager   [phone] cell 11/03/2023, 8:59 AM

## 2023-11-06 LAB — QUANTIFERON-TB GOLD PLUS
Mitogen-NIL: 9.31 [IU]/mL
NIL: 0.04 [IU]/mL
QuantiFERON-TB Gold Plus: NEGATIVE
TB1-NIL: 0.01 [IU]/mL
TB2-NIL: <0 [IU]/mL

## 2023-11-06 LAB — HIV-1 RNA QUANT-NO REFLEX-BLD
HIV 1 RNA Quant: NOT DETECTED {copies}/mL
HIV-1 RNA Quant, Log: NOT DETECTED {Log_copies}/mL

## 2023-12-22 ENCOUNTER — Encounter: Payer: Self-pay | Admitting: Family Medicine

## 2023-12-22 ENCOUNTER — Ambulatory Visit: Admitting: Family Medicine

## 2023-12-22 VITALS — BP 124/80 | HR 95 | Temp 97.7°F | Wt 284.0 lb

## 2023-12-22 DIAGNOSIS — B20 Human immunodeficiency virus [HIV] disease: Secondary | ICD-10-CM

## 2023-12-22 DIAGNOSIS — G5622 Lesion of ulnar nerve, left upper limb: Secondary | ICD-10-CM

## 2023-12-22 DIAGNOSIS — I10 Essential (primary) hypertension: Secondary | ICD-10-CM

## 2023-12-22 DIAGNOSIS — G4733 Obstructive sleep apnea (adult) (pediatric): Secondary | ICD-10-CM

## 2023-12-22 DIAGNOSIS — T148XXA Other injury of unspecified body region, initial encounter: Secondary | ICD-10-CM

## 2023-12-22 DIAGNOSIS — E7800 Pure hypercholesterolemia, unspecified: Secondary | ICD-10-CM | POA: Diagnosis not present

## 2023-12-22 DIAGNOSIS — J069 Acute upper respiratory infection, unspecified: Secondary | ICD-10-CM | POA: Diagnosis not present

## 2023-12-22 NOTE — Patient Instructions (Addendum)
 Keep up the good work with a healthy diet and exercise  Please reach out to Dr. Chalice, [facility] neurology, if you are unable to get a CPAP that works for you.  Your blood pressure looks good today.  Keep up the good work  Avoid putting pressure on your elbow and bending your arm when you are sleeping.  Let me know if the tingling and numbness in your hand does not improve.  Also, you currently have a viral illness that should run its course over the next few days.  Let me know if you are getting worse or not improving in the next 3 days.  I will see you back for a fasting physical in 3 months approximately

## 2023-12-22 NOTE — Progress Notes (Signed)
 Subjective:     Patient ID: Keith Blackwell, male    DOB: 1985/10/10, 38 y.o.   MRN: [id number]  Chief Complaint  Patient presents with   Medical Management of Chronic Issues    Numbness and tingeling in left hand for two weeks & left flank pain for a couple days     HPI  Discussed the use of AI scribe software for clinical note transcription with the patient, who gave verbal consent to proceed.  History of Present Illness Keith Blackwell is a 38 year old male with hypertension and HIV who presents for follow-up of chronic health conditions.  Hypertension management - Hypertension is well-controlled with amlodipine  10 mg and indapamide  1.25 mg. - Weight loss of 15 pounds since November 03, 2023, attributed to increased physical activity and dietary changes. - Laboratory results from July 22, 2023, show normal A1c and TSH levels. - Kidney function has improved with normal GFR.  HIV management - HIV is managed with Biktarvy . - Closely followed by ID   Upper respiratory infection symptoms - Cold symptoms for the past week, including fever, chills, and sweating. - Initial improvement followed by worsening of symptoms. - Relief achieved by sitting in the shower and drinking warm fluids. - Mucinex causes dryness. - Cough initially produced yellow sputum, now resolved. - No urinary symptoms.  Paresthesia of left hand - Tingling in the left hand, particularly in the pinky and adjacent finger. - Symptoms occur when sleeping in certain positions and sometimes during the day when leaning on his arm.  Obstructive sleep apnea and cpap intolerance - Severe sleep apnea. - Difficulty tolerating CPAP machine due to discomfort and water spitting from the mask. - Multiple mask types have been tried without satisfactory results. - sees [facility] sleep specialist but no recent follow up     Health Maintenance Due  Topic Date Due   COVID-19 Vaccine (1) Never done   Hepatitis B Vaccines  19-59 Average Risk (1 of 3 - 19+ 3-dose series) Never done   HPV VACCINES (1 - Risk 3-dose SCDM series) Never done   Influenza Vaccine  Never done    Past Medical History:  Diagnosis Date   Environmental allergies    HIV (human immunodeficiency virus infection) (HCC)    HTN (hypertension)    Migraine    Pain in left knee 10/15/2017   Pain in right knee 12/22/2017   Strain of calf muscle 10/15/2017    Past Surgical History:  Procedure Laterality Date   HERNIA REPAIR      Family History  Problem Relation Age of Onset   Sleep apnea Mother    Sleep apnea Sister     Social History   Socioeconomic History   Marital status: Single    Spouse name: Not on file   Number of children: Not on file   Years of education: Not on file   Highest education level: Not on file  Occupational History   Not on file  Tobacco Use   Smoking status: Former    Current packs/day: 0.50    Average packs/day: 0.5 packs/day for 25.9 years (12.9 ttl pk-yrs)    Types: Cigarettes    Start date: 02/1998   Smokeless tobacco: Never  Vaping Use   Vaping status: Never Used  Substance and Sexual Activity   Alcohol use: Yes    Comment: social events only   Drug use: Yes    Frequency: 7.0 times per week    Types: Marijuana  Comment: weekend use. 1/8th   Sexual activity: Yes    Partners: Female    Birth control/protection: None  Other Topics Concern   Not on file  Social History Narrative   Lives with girlfriend   Pt works    Social Drivers of Corporate Investment Banker Strain: Low Risk  (02/19/2023)   Overall Financial Resource Strain (CARDIA)    Difficulty of Paying Living Expenses: Not hard at all  Food Insecurity: No Food Insecurity (02/19/2023)   Hunger Vital Sign    Worried About Running Out of Food in the Last Year: Never true    Ran Out of Food in the Last Year: Never true  Transportation Needs: No Transportation Needs (02/19/2023)   PRAPARE - Scientist, Research (physical Sciences) (Medical): No    Lack of Transportation (Non-Medical): No  Physical Activity: Insufficiently Active (02/19/2023)   Exercise Vital Sign    Days of Exercise per Week: 3 days    Minutes of Exercise per Session: 10 min  Stress: Stress Concern Present (02/19/2023)   [employer] of Occupational Health - Occupational Stress Questionnaire    Feeling of Stress : Very much  Social Connections: Moderately Integrated (02/19/2023)   Social Connection and Isolation Panel    Frequency of Communication with Friends and Family: Three times a week    Frequency of Social Gatherings with Friends and Family: Once a week    Attends Religious Services: More than 4 times per year    Active Member of [employer] or Organizations: No    Attends Banker Meetings: Never    Marital Status: Living with partner  Intimate Partner Violence: Not At Risk (02/19/2023)   Humiliation, Afraid, Rape, and Kick questionnaire    Fear of Current or Ex-Partner: No    Emotionally Abused: No    Physically Abused: No    Sexually Abused: No    Outpatient Medications Prior to Visit  Medication Sig Dispense Refill   amLODipine  (NORVASC ) 10 MG tablet TAKE 1 TABLET BY MOUTH EVERY DAY 90 tablet 0   bictegravir-emtricitabine-tenofovir AF (BIKTARVY ) 50-200-25 MG TABS tablet Take 1 tablet by mouth daily. 30 tablet 11   cetirizine (ZYRTEC) 10 MG tablet Take 10 mg by mouth daily.     fluticasone  (FLONASE ) 50 MCG/ACT nasal spray SPRAY 1 SPRAY INTO BOTH NOSTRILS DAILY. 16 mL 1   indapamide  (LOZOL ) 1.25 MG tablet TAKE 1 TABLET BY MOUTH DAILY. 90 tablet 0   amoxicillin -clavulanate (AUGMENTIN ) 875-125 MG tablet Take 1 tablet by mouth 2 (two) times daily. (Patient not taking: Reported on 11/03/2023) 20 tablet 0   No facility-administered medications prior to visit.    No Known Allergies  Review of Systems  Constitutional:  Positive for chills and weight loss. Negative for fever and malaise/fatigue.  HENT:  Positive  for congestion.   Respiratory:  Positive for cough and sputum production. Negative for shortness of breath and wheezing.   Cardiovascular:  Negative for chest pain, palpitations and leg swelling.  Gastrointestinal:  Negative for abdominal pain, constipation, diarrhea, nausea and vomiting.  Genitourinary:  Negative for dysuria, frequency and urgency.  Musculoskeletal:  Positive for back pain.  Neurological:  Positive for tingling. Negative for dizziness, focal weakness and headaches.       Objective:    Physical Exam Constitutional:      General: He is not in acute distress.    Appearance: He is not ill-appearing.  HENT:     Nose: Nose normal.  Mouth/Throat:     Mouth: Mucous membranes are moist.     Pharynx: Oropharynx is clear.  Eyes:     Extraocular Movements: Extraocular movements intact.     Conjunctiva/sclera: Conjunctivae normal.     Pupils: Pupils are equal, round, and reactive to light.  Cardiovascular:     Rate and Rhythm: Normal rate and regular rhythm.  Pulmonary:     Effort: Pulmonary effort is normal.     Breath sounds: Normal breath sounds.  Musculoskeletal:        General: Normal range of motion.     Cervical back: Normal range of motion and neck supple. No tenderness.     Right lower leg: No edema.     Left lower leg: No edema.  Lymphadenopathy:     Cervical: No cervical adenopathy.  Skin:    General: Skin is warm and dry.  Neurological:     General: No focal deficit present.     Mental Status: He is alert and oriented to person, place, and time.  Psychiatric:        Mood and Affect: Mood normal.        Behavior: Behavior normal.        Thought Content: Thought content normal.      BP 124/80   Pulse 95   Temp 97.7 F (36.5 C)   Wt 284 lb (128.8 kg)   SpO2 94%   BMI 41.94 kg/m  Wt Readings from Last 3 Encounters:  12/22/23 284 lb (128.8 kg)  11/03/23 298 lb (135.2 kg)  07/22/23 299 lb (135.6 kg)       Assessment & Plan:   Problem  List Items Addressed This Visit     HIV disease (HCC)   Primary hypertension - Primary   Pure hypercholesterolemia   Severe obesity (BMI >= 40) (HCC)   Other Visit Diagnoses       Acute URI         OSA (obstructive sleep apnea)         Muscle strain         Cubital tunnel syndrome on left           Assessment and Plan Assessment & Plan Essential hypertension Blood pressure is well-controlled at 124/80 mmHg on current regimen of amlodipine  10 mg and indapamide  1.25 mg daily. - Continue current antihypertensive regimen - Continue low sodium diet and weight loss   Morbid obesity Weight loss of 15 pounds since September 30th, 2024, attributed to increased physical activity and dietary changes. - Continue current lifestyle modifications for weight management.  Pure hypercholesterolemia Cholesterol levels were not optimal at last check but are being managed. - Will recheck cholesterol levels at next physical examination.  Chronic kidney disease Kidney function has improved with normal GFR on recent labs.  Obstructive sleep apnea Not using CPAP due to issues with mask fit and water leakage. Considering alternative options such as a nasal mask or mouthpiece. - Contact CPAP provider to explore alternative mask options. - If issues persist, schedule follow-up with Dr. Chalice for further management.  Acute upper respiratory infection Symptoms include fever, chills, and cough with yellow sputum transitioning to clear, indicating a viral infection. No antibiotics needed as symptoms are improving. - Monitor symptoms and report if no improvement by Friday. - Continue supportive care with hydration and symptomatic treatment.  Cubital tunnel syndrome left Intermittent tingling in left hand, likely due to ulnar nerve compression from sleeping position. Symptoms improve with positional changes. -  Use a pillow or brace to keep elbow straight during sleep. - Monitor symptoms and report  if he worsens or occurs without elbow flexion.     I have discontinued Keland J. Heeney's amoxicillin -clavulanate. I am also having him maintain his cetirizine, fluticasone , amLODipine , indapamide , and bictegravir-emtricitabine-tenofovir AF.  No orders of the defined types were placed in this encounter.

## 2024-01-07 ENCOUNTER — Other Ambulatory Visit: Payer: Self-pay | Admitting: Family Medicine

## 2024-01-07 DIAGNOSIS — I10 Essential (primary) hypertension: Secondary | ICD-10-CM

## 2024-03-23 ENCOUNTER — Encounter: Admitting: Family Medicine

## 2024-11-02 ENCOUNTER — Ambulatory Visit: Admitting: Internal Medicine
